# Patient Record
Sex: Female | Born: 1995
Health system: Southern US, Community
[De-identification: ages and names within clinical notes are randomized; demographics above are authoritative.]

## PROBLEM LIST (undated history)

## (undated) DIAGNOSIS — F419 Anxiety disorder, unspecified: Secondary | ICD-10-CM

## (undated) HISTORY — DX: Anxiety disorder, unspecified: F41.9

---
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
## 2017-01-15 ENCOUNTER — Ambulatory Visit (INDEPENDENT_AMBULATORY_CARE_PROVIDER_SITE_OTHER): Payer: BLUE CROSS/BLUE SHIELD | Admitting: Family

## 2017-01-15 ENCOUNTER — Encounter: Payer: Self-pay | Admitting: Family

## 2017-01-15 VITALS — BP 102/64 | HR 68 | Resp 16 | Ht 59.5 in | Wt 99.6 lb

## 2017-01-15 DIAGNOSIS — Z719 Counseling, unspecified: Secondary | ICD-10-CM | POA: Diagnosis not present

## 2017-01-15 DIAGNOSIS — Z7189 Other specified counseling: Secondary | ICD-10-CM

## 2017-01-15 DIAGNOSIS — Z8659 Personal history of other mental and behavioral disorders: Secondary | ICD-10-CM | POA: Diagnosis not present

## 2017-01-15 DIAGNOSIS — Z79899 Other long term (current) drug therapy: Secondary | ICD-10-CM | POA: Diagnosis not present

## 2017-01-15 DIAGNOSIS — F900 Attention-deficit hyperactivity disorder, predominantly inattentive type: Secondary | ICD-10-CM

## 2017-01-15 MED ORDER — GUANFACINE HCL ER 2 MG PO TB24: 2.0000 mg | ORAL_TABLET | Freq: Every day | ORAL | 2 refills | Status: DC

## 2017-01-15 MED ORDER — AMPHETAMINE SULFATE 10 MG PO TABS: 10.0000 mg | ORAL_TABLET | Freq: Every day | ORAL | 0 refills | Status: DC

## 2017-01-15 NOTE — Progress Notes (Signed)
[facility] [address] [place] [place] [address] Dept: [phone] Dept Fax: [phone] Loc: [phone] Loc Fax: [phone]  Medical Follow-up  Patient ID: [name], female  DOB: Sep 20, 1995, 21 y.o.  MRN: [id number]  Date of Evaluation: 01/15/17  PCP: [name], MD  Accompanied by: Self Patient Lives with: On campus in dorm-single room  HISTORY/CURRENT STATUS:  HPI  Patient here for routine follow up related to ADHD and medication management. Patient on medication last year and over focused last year. PCP worried that she was not gaining weight, but has always been on the smaller side. Came off the medication for the summer and increased problems with completing work. Retried Vyvanse with increased anxiety, weight loss, sleep issues, and constant worrying. Stopped Vyvanse and tried Adderall XR with increased side effects. Now not taking any medications, but wanting to retry a different medication with guidance.   EDUCATION: School: [employer], [employer] and [employer] Year/Grade: Sophomore Homework Time: Increased amount of time Performance/Grades: above average Services: Other: Disability services Activities/Exercise: participates in field hockey  MEDICAL HISTORY: Appetite: Good MVI/Other: Daily  Fruits/Vegs:Some Calcium: Some Iron:Some  Sleep: Bedtime: 11-12:00 am  Awakens: 7-9:00 am Sleep Concerns: Initiation/Maintenance/Other: No problems with falling asleep or staying asleep  Individual Medical History/Review of System Changes? No problems and no recent doctor visits by patient.   Allergies: Patient has no known allergies.  Current Medications:  Current Outpatient Prescriptions:  .  norgestimate-ethinyl estradiol (ORTHO-CYCLEN,SPRINTEC,PREVIFEM) 0.25-35 MG-MCG tablet, Take 1 tablet by mouth daily.,  Disp: 3 Package, Rfl: 2 .  Amphetamine Sulfate ([name]) 10 MG TABS, Take 10 mg by mouth daily., Disp: 30 tablet, Rfl: 0 .  guanFACINE (INTUNIV) 2 MG TB24 ER tablet, Take 1 tablet (2 mg total) by mouth daily., Disp: 30 tablet, Rfl: 2 Medication Side Effects: None  Family Medical/Social History Changes?: None reported  MENTAL HEALTH: Mental Health Issues: Socially feels some what isolated, some interactions and has club sports  PHYSICAL EXAM: Vitals:  Today's Vitals   01/15/17 1027  BP: 102/64  Pulse: 68  Resp: 16  Weight: 99 lb 9.6 oz (45.2 kg)  Height: 4' 11.5" (1.511 m)  PainSc: 0-No pain  , 25 %ile (Z= -0.67) based on CDC 2-20 Years BMI-for-age data using vitals from 01/15/2017.  General Exam: Physical Exam  Constitutional: She is oriented to person, place, and time. She appears well-developed and well-nourished.  HENT:  Head: Normocephalic and atraumatic.  Right Ear: External ear normal.  Left Ear: External ear normal.  Mouth/Throat: Oropharynx is clear and moist.  Eyes: Pupils are equal, round, and reactive to light. Conjunctivae and EOM are normal.  Neck: Normal range of motion. Neck supple.  Cardiovascular: Normal rate, regular rhythm, normal heart sounds and intact distal pulses.   Pulmonary/Chest: Effort normal and breath sounds normal.  Abdominal: Soft. Bowel sounds are normal.  Genitourinary:  Genitourinary Comments: deferred  Musculoskeletal: Normal range of motion.  Neurological: She is alert and oriented to person, place, and time. She has normal reflexes.  Skin: Skin is warm and dry. Capillary refill takes less than 2 seconds.  Psychiatric: She has a normal mood and affect. Her behavior is normal. Judgment and thought content normal.  Vitals reviewed.  Review of Systems  Psychiatric/Behavioral: Positive for decreased concentration and sleep disturbance. The patient is nervous/anxious.   All other systems reviewed and are negative.  No concerns for  toileting.  Daily stool, no constipation or diarrhea. Void urine no difficulty. No enuresis.   Participate in daily oral hygiene to include brushing and flossing.  Neurological: oriented to time, place, and person Cranial Nerves: normal  Neuromuscular:  Motor Mass: Normal Tone: Normal Strength: Normal DTRs: 2+ and symmetric Overflow: None Reflexes: no tremors noted Sensory Exam: Vibratory: Intact  Fine Touch: Intact  Testing/Developmental Screens:  ASRS: 20/15 scored by patinet and counseled   DIAGNOSES:    ICD-10-CM   1. ADHD (attention deficit hyperactivity disorder), inattentive type F90.0   2. History of anxiety Z86.59   3. Patient counseled Z71.9   4. Medication management Z79.899   5. Counseling regarding goals of care Z71.89     RECOMMENDATIONS: 3-4 weeks for medication management. Counseled on restarting new medication, [name] 1/4-1 tablet daily with instructions on titration, # 30 with no refill script printed and given to patient today. To continue with Intuniv 2 mg daily, # 30 with 2 RF's printed today.   Counseled patient on medication administration, effects, and possible side effects. ADHD medications discussed to include different medications and pharmacologic properties of each. Recommendation for specific medication to include dose, administration, expected effects, possible side effects and the risk to benefit ratio of medication management at today's visit with patient of [name].  Advised patient to get at least 8-10 hours of sleep each night with good sleep hygiene reviewed with patient. Continue on a routine sleep/wake schedule with decreasing caffeine in the evening and shutting off all screens at least 1 hour before bedtime. Can take melatonin as needed for initiation.   Information reviewed with patient regarding healthy dietary intake of protein and fruits/vegetable with nuts for at least 3 meals plus snack each day to maintain weight.   Suggested regular  exercise for 30 mins at least 3-4 times/week. Patient playing club field hockey and waking, but encouraged more exercise when able to on campus.  Directed to f/u with PCP as needed, dentist every 6 months, MVI daily, dietary intake of good calories/protein, exercise regularly, f/u with GYN, counseling, and dermatology for health maintenance.   Patient verbalized understanding of all topics discussed at today's visit.   NEXT APPOINTMENT: Return in about 3 weeks (around 02/05/2017) for follow up visit.  More than 50% of the appointment was spent counseling and discussing diagnosis and management of symptoms with the patient and family.  Carron Curie, NP Counseling Time: 30 mins Total Contact Time: 40 mins

## 2017-02-12 ENCOUNTER — Ambulatory Visit (INDEPENDENT_AMBULATORY_CARE_PROVIDER_SITE_OTHER): Payer: BLUE CROSS/BLUE SHIELD | Admitting: Family

## 2017-02-12 ENCOUNTER — Encounter: Payer: Self-pay | Admitting: Family

## 2017-02-12 VITALS — BP 112/64 | HR 68 | Resp 16 | Ht 59.5 in | Wt 97.4 lb

## 2017-02-12 DIAGNOSIS — F411 Generalized anxiety disorder: Secondary | ICD-10-CM

## 2017-02-12 DIAGNOSIS — F900 Attention-deficit hyperactivity disorder, predominantly inattentive type: Secondary | ICD-10-CM | POA: Diagnosis not present

## 2017-02-12 DIAGNOSIS — Z719 Counseling, unspecified: Secondary | ICD-10-CM | POA: Diagnosis not present

## 2017-02-12 DIAGNOSIS — Z79899 Other long term (current) drug therapy: Secondary | ICD-10-CM

## 2017-02-12 MED ORDER — AMPHETAMINE SULFATE 10 MG PO TABS: 10.0000 mg | ORAL_TABLET | Freq: Every day | ORAL | 0 refills | Status: DC

## 2017-02-12 NOTE — Progress Notes (Signed)
[facility] [address] [place] [place] [address] Dept: [phone] Dept Fax: [phone] Loc: [phone] Loc Fax: [phone]  Medication Check  Patient ID: Vickie Miller, female  DOB: [birth year], 21 y.o.  MRN: [id number]  Date of Evaluation: 02/12/17  PCP: [name], MD  Accompanied by: Self Patient Lives with: Dorm room on campus-single  HISTORY/CURRENT STATUS: HPI  Patient here for routine follow up related to ADHD, Anxiety, and medication management. Patient here by herself for today's visit and updates with recent change in medication. [name] now 10 mg daily recently with slow progression to get to full tablet with no sleep issues or side effects. Feels better on this medication without increased issues with side effects  Will also continue with Intuniv 2 mg daily with no reported side effects.   EDUCATION: School: [employer] Year/Grade: Sophomore Homework Hours Spent: Dependging on class demands Performance/ Grades: above average Services: Careers information officer Exercise: intermittently-field hockey  MEDICAL HISTORY: Appetite: Some decrease with afternoon, but eating a good breakfast with protein  MVI/Other: Daily  Fruits/Vegs: Variety Calcium: Some mg  Iron: Variety.   Sleep: Getting in bed at 10-12:00 and waking by 9:00 am the latest Concerns: Initiation/Maintenance/Other: None recently and will take Melatonin 5 mg 1/2 tablet prn.   Individual Medical History/ Review of Systems: Changes? :None reported recently.   Allergies: Patient has no known allergies.  Current Medications:  Current Outpatient Prescriptions:  .  Amphetamine Sulfate ([name]) 10 MG TABS, Take 10-20 mg by mouth daily., Disp: 60 tablet, Rfl: 0 .  guanFACINE (INTUNIV) 2 MG TB24 ER tablet, Take 1 tablet (2 mg total) by mouth daily., Disp: 30  tablet, Rfl: 2 .  norgestimate-ethinyl estradiol (ORTHO-CYCLEN,SPRINTEC,PREVIFEM) 0.25-35 MG-MCG tablet, Take 1 tablet by mouth daily., Disp: 3 Package, Rfl: 2 Medication Side Effects: None  Family Medical/ Social History: Changes? None reported recently.   MENTAL HEALTH: Mental Health Issues: Anxiety-lower now  PHYSICAL EXAM; Vitals: There were no vitals taken for this visit.  General Physical Exam: Unchanged from previous exam, date:01/15/17 Changed: Medication  Testing/Developmental Screens:  14/12 scored by patient and counseled   DIAGNOSES:    ICD-10-CM   1. ADHD (attention deficit hyperactivity disorder), inattentive type F90.0   2. Generalized anxiety disorder F41.1   3. Patient counseled Z71.9   4. Medication management Z79.899     RECOMMENDATIONS: 3 month follow up visit and continuation of medication. Counseled on medication management of [name] to try 1-2 tablets daily 10 mg, # 60 with no refills printed and given to patient. Intuniv 2 mg daily no refills today.   Counseled patient on medication administration, effects, and possible side effects. ADHD medications discussed to include different medications and pharmacologic properties of each. Recommendation for specific medication to include dose, administration, expected effects, possible side effects and the risk to benefit ratio of medication management at today's visit for [name].   Advised patient to increase the amount of protein daily to sustain weight and manage her limited intake during the day. Encouraged an increased amount of smaller meals during the day with protein shakes at least 1-2 daily.   Instructed patient to continue with physical activities as her schedule allows. To restart physical activity when possible with anxiety control.  Directed to call for future scripts and support given. Patient verbalized understanding of all topics discussed at today's visit.   NEXT APPOINTMENT: Return in about 3  months (around 05/15/2017) for follow up visit.  More than 50% of the appointment was spent counseling and discussing diagnosis and management of symptoms with the patient and family.  Carron Curieawn M Paretta-Leahey, NP Counseling Time: 25 mins Total Contact Time: 30 mins

## 2017-03-18 DIAGNOSIS — Z0000 Encounter for general adult medical examination without abnormal findings: Secondary | ICD-10-CM | POA: Diagnosis not present

## 2017-04-13 ENCOUNTER — Other Ambulatory Visit: Payer: Self-pay | Admitting: Family

## 2017-04-13 NOTE — Telephone Encounter (Signed)
Printed Rx and placed at front desk for pick-up-[name] 10 mg BID, # 60 with no refills.

## 2017-04-29 DIAGNOSIS — Z0000 Encounter for general adult medical examination without abnormal findings: Secondary | ICD-10-CM | POA: Diagnosis not present

## 2017-05-04 DIAGNOSIS — L218 Other seborrheic dermatitis: Secondary | ICD-10-CM | POA: Diagnosis not present

## 2017-05-06 DIAGNOSIS — F909 Attention-deficit hyperactivity disorder, unspecified type: Secondary | ICD-10-CM | POA: Diagnosis not present

## 2017-05-06 DIAGNOSIS — Z23 Encounter for immunization: Secondary | ICD-10-CM | POA: Diagnosis not present

## 2017-05-06 DIAGNOSIS — Z0000 Encounter for general adult medical examination without abnormal findings: Secondary | ICD-10-CM | POA: Diagnosis not present

## 2017-05-08 ENCOUNTER — Other Ambulatory Visit: Payer: Self-pay | Admitting: Family

## 2017-05-08 NOTE — Telephone Encounter (Signed)
RX for Intuniv 2 mg daily # 30 with 2 RF's e-scribed and sent to pharmacy-[facility] on file.

## 2017-05-15 ENCOUNTER — Encounter: Payer: Self-pay | Admitting: Family

## 2017-05-15 ENCOUNTER — Ambulatory Visit (INDEPENDENT_AMBULATORY_CARE_PROVIDER_SITE_OTHER): Payer: BLUE CROSS/BLUE SHIELD | Admitting: Family

## 2017-05-15 ENCOUNTER — Institutional Professional Consult (permissible substitution): Payer: BLUE CROSS/BLUE SHIELD | Admitting: Family

## 2017-05-15 VITALS — BP 102/64 | HR 64 | Resp 16 | Ht 59.5 in | Wt 96.0 lb

## 2017-05-15 DIAGNOSIS — F411 Generalized anxiety disorder: Secondary | ICD-10-CM

## 2017-05-15 DIAGNOSIS — Z719 Counseling, unspecified: Secondary | ICD-10-CM | POA: Diagnosis not present

## 2017-05-15 DIAGNOSIS — Z79899 Other long term (current) drug therapy: Secondary | ICD-10-CM

## 2017-05-15 DIAGNOSIS — F900 Attention-deficit hyperactivity disorder, predominantly inattentive type: Secondary | ICD-10-CM

## 2017-05-15 MED ORDER — AMPHETAMINE SULFATE 10 MG PO TABS: 1.0000 | ORAL_TABLET | Freq: Every day | ORAL | 0 refills | Status: DC

## 2017-05-15 NOTE — Progress Notes (Signed)
[facility] [address] [place] [address] Dept: [phone] Dept Fax: [phone] Loc: [phone] Loc Fax: [phone]  Medication Check  Patient ID: Nelma RothmanFlorentina A Sillas, female  DOB: [birth year], 22 y.o.  MRN: [id number]  Date of Evaluation: 05/19/2017   PCP: Merri BrunettePharr, Walter, MD  Accompanied by: Self Patient Lives with: Dorm room on campus-single  HISTORY/CURRENT STATUS: HPI  Patient here for routine follow up related to ADHD, Anxiety, and medication management. Patient here by herself for today's visit and updates given for recent changes with school semester. No recent issues with health and eating ok with no issues. Sleeping well with no current issues. Has done well the first part of the semester and will finish with her mini-mester the first part of February before spring semester starts. Has continued with [name] and Intuniv with no reported side effects.   EDUCATION: School: [employer] Year/Grade: sophomore Homework Hours Spent: Depending on class demands Performance/ Grades: above average Services: Other: Disability Services Activities/ Exercise: intermittently  MEDICAL HISTORY: Appetite:Decreased appetite most morning, eating a good dinner  MVI/Other: None  Fruits/Vegs: Some good variety Calcium: Good amount  Iron: Variety  Sleep: Most nights will get about 7 hours/night sleep Concerns: Initiation/Maintenance/Other: No reported issues recently  Individual Medical History/ Review of Systems: Changes? :None reported recently per patient  Allergies: Patient has no known allergies.  Current Medications:  Current Outpatient Medications:  .  Amphetamine Sulfate ([name]) 10 MG TABS, Take 1-2 tablets by mouth daily. Do not fill until 06/12/17, Disp: 60 tablet, Rfl: 0 .  guanFACINE (INTUNIV) 2 MG TB24 ER tablet, take 1 tablet by  mouth once daily, Disp: 30 tablet, Rfl: 2 .  norgestimate-ethinyl estradiol (ORTHO-CYCLEN,SPRINTEC,PREVIFEM) 0.25-35 MG-MCG tablet, Take 1 tablet by mouth daily., Disp: 3 Package, Rfl: 4 Medication Side Effects: None  Family Medical/ Social History: Changes? None recently  MENTAL HEALTH: Mental Health Issues: Anxiety-no problems reported recently  PHYSICAL EXAM; Vitals: Vitals:   05/15/17 1509  BP: 102/64  Pulse: 64  Resp: 16  Weight: 96 lb (43.5 kg)  Height: 4' 11.5" (1.511 m)    General Physical Exam: Unchanged from previous exam, date:02/12/17 Changed:none recently  Physical Exam  Constitutional: She is oriented to person, place, and time. She appears well-developed and well-nourished.  HENT:  Head: Normocephalic and atraumatic.  Right Ear: External ear normal.  Left Ear: External ear normal.  Mouth/Throat: Oropharynx is clear and moist.  Eyes: Conjunctivae and EOM are normal. Pupils are equal, round, and reactive to light.  Neck: Normal range of motion. Neck supple.  Cardiovascular: Normal rate, regular rhythm, normal heart sounds and intact distal pulses.  Pulmonary/Chest: Effort normal and breath sounds normal.  Abdominal: Soft. Bowel sounds are normal.  Genitourinary:  Genitourinary Comments: Deferred  Musculoskeletal: Normal range of motion.  Neurological: She is alert and oriented to person, place, and time. She has normal reflexes.  Skin: Skin is warm and dry. Capillary refill takes less than 2 seconds.  Psychiatric: She has a normal mood and affect. Her behavior is normal. Judgment and thought content normal.  Vitals reviewed.  Review of Systems  All other systems reviewed and are negative.  Patient with no concerns for toileting. Daily stool, no constipation or diarrhea. Void urine no difficulty. No enuresis.   Participate in daily oral hygiene to include brushing and flossing.  Testing/Developmental Screens: ASRS-Did not complete at today's  visit.  DIAGNOSES:  ICD-10-CM   1. ADHD (attention deficit hyperactivity disorder), inattentive type F90.0   2. Generalized anxiety disorder F41.1   3. Patient counseled Z71.9   4. Medication management Z79.899     RECOMMENDATIONS: 3 month follow up and continuation with medication. Rx for [name] 10 mg daily 1-2 tablets, # 60 with escribing Intuniv 2 mg daily, # 30 with 2 RF's sent to pharmacy on file.   Reviewed old records and/or current chart since last follow up visit on 01/15/17.   Discussed recent history and today's examination with questions answered and concerns address with paitent.   Counseled regarding anticipatory guidance related to school this year and transfer plans in the future.   Recommended a high protein, low sugar and preservatives diet for ADHD patients with limited junk or fast foods along with increasing water intake daily.   Counseled on the need to increase exercise and make healthy eating choices with more variety of foods each daily and eating at least 3-5 smaller meals. Increasing physical activity when able to and increasing her water intake.   Discussed school progress and advocated for appropriate accommodations/modifications as needed in the college setting. Smaller class sizes now and not causing increased anxiety.   Advised on medication options, administration, effects, and possible side effects with [name] and Intuniv reviewed with patient.   Instructed on the importance of good sleep hygiene, a routine bedtime, no TV in bedroom and turning off all electronics at least 1 hour before bedtime.   Directed to f/u with PCP yearly, GYN for routine care, dentist every 6 months, MVI daily, healthy eating habits, regular exercise, and health sleep habits.   NEXT APPOINTMENT: Return in about 3 months (around 08/13/2017) for follow up visit.   More than 50% of the appointment was spent counseling and discussing diagnosis and management of symptoms with the  patient and family.  Carron Curie, NP Counseling Time: 30 mins Total Contact Time: 40 mins

## 2017-05-18 ENCOUNTER — Encounter: Payer: Self-pay | Admitting: Women's Health

## 2017-05-19 ENCOUNTER — Ambulatory Visit (INDEPENDENT_AMBULATORY_CARE_PROVIDER_SITE_OTHER): Payer: BLUE CROSS/BLUE SHIELD | Admitting: Women's Health

## 2017-05-19 ENCOUNTER — Encounter: Payer: Self-pay | Admitting: Family

## 2017-05-19 ENCOUNTER — Encounter: Payer: Self-pay | Admitting: Women's Health

## 2017-05-19 VITALS — BP 110/78 | Ht 59.0 in | Wt 98.0 lb

## 2017-05-19 DIAGNOSIS — Z3041 Encounter for surveillance of contraceptive pills: Secondary | ICD-10-CM

## 2017-05-19 DIAGNOSIS — Z01419 Encounter for gynecological examination (general) (routine) without abnormal findings: Secondary | ICD-10-CM | POA: Diagnosis not present

## 2017-05-19 MED ORDER — NORGESTIMATE-ETH ESTRADIOL 0.25-35 MG-MCG PO TABS: 1.0000 | ORAL_TABLET | Freq: Every day | ORAL | 4 refills | Status: DC

## 2017-05-19 NOTE — Patient Instructions (Signed)

## 2017-05-19 NOTE — Progress Notes (Signed)
Vickie Miller 08/29/1995 [id number]    History:    Presents for annual exam.  Regular monthly cycle on Sprintec with good relief of dysmenorrhea and menorrhagia. Virgin. Gardasil series completed.  Past medical history, past surgical history, family history and social history were all reviewed and documented in the EPIC chart. Sophomore at [employer] program doing well. [place] from [place] no known family history.  ROS:  A ROS was performed and pertinent positives and negatives are included.  Exam:  Vitals:   05/19/17 0933  BP: 110/78  Weight: 98 lb (44.5 kg)  Height: 4\' 11"  (1.499 m)   Body mass index is 19.79 kg/m.   General appearance:  Normal Thyroid:  Symmetrical, normal in size, without palpable masses or nodularity. Respiratory  Auscultation:  Clear without wheezing or rhonchi Cardiovascular  Auscultation:  Regular rate, without rubs, murmurs or gallops  Edema/varicosities:  Not grossly evident Abdominal  Soft,nontender, without masses, guarding or rebound.  Liver/spleen:  No organomegaly noted  Hernia:  None appreciated  Skin  Inspection:  Grossly normal   Breasts: Examined lying and sitting.     Right: Without masses, retractions, discharge or axillary adenopathy.     Left: Without masses, retractions, discharge or axillary adenopathy. Gentitourinary   Inguinal/mons:  Normal without inguinal adenopathy  External genitalia:  Normal  BUS/Urethra/Skene's glands:  Normal  Vagina:  Normal  Cervix: Not visualized  Uterus:   normal in size, shape and contour.  Midline and mobile  Adnexa/parametria:     Rt: Without masses or tenderness.   Lt: Without masses or tenderness.  Anus and perineum: Normal    Assessment/Plan:  22 y.o. S [place]  [place] for annual exam with no complaints.  Monthly cycle on Sprintec ADD-primary care manages labs and meds  Plan: Sprintec prescription, proper use, slight risk for blood clots and strokes reviewed. Importance of  condoms if sexually active discussed. Campus safety reviewed. SBE's, exercise, calcium rich diet, MVI daily encouraged. Reports normal CBC at primary care.  Harrington Challengerancy J Shakenna Herrero [facility] CenterWHNP, 10:44 AM 05/19/2017

## 2017-05-25 ENCOUNTER — Telehealth: Payer: Self-pay | Admitting: Family

## 2017-05-25 NOTE — Telephone Encounter (Signed)
[facility]  sent [facility] for [facility]  for Guanfacine HCL Er 2 mg tab.They sent more than one form which did not have a medication name on it .All form are in prior authorization box.

## 2017-06-04 ENCOUNTER — Institutional Professional Consult (permissible substitution): Payer: BLUE CROSS/BLUE SHIELD | Admitting: Family

## 2017-08-31 ENCOUNTER — Other Ambulatory Visit: Payer: Self-pay

## 2017-08-31 MED ORDER — AMPHETAMINE SULFATE 10 MG PO TABS: 1.0000 | ORAL_TABLET | Freq: Every day | ORAL | 0 refills | Status: DC

## 2017-08-31 NOTE — Telephone Encounter (Signed)
Mom called in for refill for [name]. Last visit 05/15/2017. [facility] [address]

## 2017-08-31 NOTE — Telephone Encounter (Signed)
E-Prescribed [name] directly to  [id number] [place] - [address] [place] [address] Phone: [phone] Fax: [phone]

## 2017-10-26 ENCOUNTER — Other Ambulatory Visit: Payer: Self-pay

## 2017-10-26 NOTE — Telephone Encounter (Signed)
Tried to reach patient on cell phone - no voice mail.  Called home number and left message for her to call and schedule appointment.

## 2017-10-26 NOTE — Telephone Encounter (Addendum)
Mom called in for refill for [name]. Last visit 05/15/2017 next visit 11/20/2107. [facility] [address]

## 2017-10-28 ENCOUNTER — Telehealth: Payer: Self-pay

## 2017-10-28 MED ORDER — AMPHETAMINE SULFATE 10 MG PO TABS: 1.0000 | ORAL_TABLET | Freq: Every day | ORAL | 0 refills | Status: DC

## 2017-10-28 NOTE — Telephone Encounter (Signed)
Pharm faxed in Prior Auth for [name]. Prior Auth submitted into CoverMyMeds

## 2017-11-02 NOTE — Telephone Encounter (Signed)
Outcome  Approvedon July 5  Effective from 10/28/2017 through 10/26/2020.

## 2017-11-19 ENCOUNTER — Ambulatory Visit (INDEPENDENT_AMBULATORY_CARE_PROVIDER_SITE_OTHER): Payer: BLUE CROSS/BLUE SHIELD | Admitting: Family

## 2017-11-19 ENCOUNTER — Encounter: Payer: Self-pay | Admitting: Family

## 2017-11-19 VITALS — BP 102/62 | HR 72 | Resp 16 | Ht 59.5 in | Wt 96.0 lb

## 2017-11-19 DIAGNOSIS — Z713 Dietary counseling and surveillance: Secondary | ICD-10-CM

## 2017-11-19 DIAGNOSIS — F411 Generalized anxiety disorder: Secondary | ICD-10-CM | POA: Diagnosis not present

## 2017-11-19 DIAGNOSIS — Z79899 Other long term (current) drug therapy: Secondary | ICD-10-CM

## 2017-11-19 DIAGNOSIS — Z719 Counseling, unspecified: Secondary | ICD-10-CM

## 2017-11-19 DIAGNOSIS — F900 Attention-deficit hyperactivity disorder, predominantly inattentive type: Secondary | ICD-10-CM

## 2017-11-19 MED ORDER — AMPHETAMINE SULFATE 10 MG PO TABS: 1.0000 | ORAL_TABLET | Freq: Every day | ORAL | 0 refills | Status: DC

## 2017-11-19 MED ORDER — GUANFACINE HCL ER 2 MG PO TB24: 2.0000 mg | ORAL_TABLET | Freq: Every day | ORAL | 2 refills | Status: DC

## 2017-11-19 NOTE — Progress Notes (Signed)
[facility] [address] [place] [address] Dept: [phone] Dept Fax: [phone] Loc: [phone] Loc Fax: [phone]  Follow up/Medication Check  Patient ID: Vickie Miller, female  DOB: 11-29-1995, 22 y.o.  MRN: [id number]  Date of Evaluation: 11/19/2017  PCP: Merri BrunettePharr, Walter, MD  Accompanied by: self Patient Lives with: mother and father  HISTORY/CURRENT STATUS: HPI  Patient here for routine follow up related to ADHD, Anxiety, and medication management. Patient here by herself for today's visit and interactive with provider. No recent issues with school and taking 1 online class this summer. To take 15 credit hours next semester and studying abroad for the winter term. 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 Has continued to take Concerta 36 mg most days this summer and will continue to take regularly next year. No reported side effects or adverse effects of the medication.  EDUCATION: School: [employer] and looking at transferring to [employer] for [employer] Year/Grade: [employer]ophomore/Junior Homework Hours Spent: Online class now Performance/ Grades: average Services: Other: Disability Services on campus Activities/ Exercise: intermittently-working out more  MEDICAL HISTORY: Appetite: Better  MVI/Other: Ensure drinks to supplement diet  Fruits/Vegs: some Calcium: Some Iron: variety  Sleep: Trouble falling asleep depending on activities. Some nights will sleep 4 hours. Can use Melatonin as needed for sleep initiation. Sleeping well at school, but not as tired at home.   Individual Medical History/ Review of Systems: Changes? :No doctor visits recently. Patient reported recently over the last 2 weeks or so increased HR that she has noticed.   Allergies: Patient has no known  allergies.  Current Medications:  Current Outpatient Medications:  .  Amphetamine Sulfate ([name]) 10 MG TABS, Take 1-2 tablets by mouth daily., Disp: 60 tablet, Rfl: 0 .  guanFACINE (INTUNIV) 2 MG TB24 ER tablet, Take 1 tablet (2 mg total) by mouth daily., Disp: 30 tablet, Rfl: 2 .  norgestimate-ethinyl estradiol (ORTHO-CYCLEN,SPRINTEC,PREVIFEM) 0.25-35 MG-MCG tablet, Take 1 tablet by mouth daily., Disp: 3 Package, Rfl: 4 Medication Side Effects: None  Family Medical/ Social History: Changes? None reported recently.   MENTAL HEALTH: Mental Health Issues: Anxiety-some decreased  PHYSICAL EXAM; Vitals: Vitals:   11/19/17 0814  BP: 102/62  Pulse: 72  Resp: 16  Weight: 96 lb (43.5 kg)  Height: 4' 11.5" (1.511 m)   Physical Exam  Constitutional: She is oriented to person, place, and time. She appears well-developed and well-nourished.  HENT:  Head: Normocephalic and atraumatic.  Right Ear: External ear normal.  Left Ear: External ear normal.  Nose: Nose normal.  Mouth/Throat: Oropharynx is clear and moist.  Eyes: Pupils are equal, round, and reactive to light. Conjunctivae and EOM are normal.  Neck: Normal range of motion. Neck supple.  Cardiovascular: Normal rate, regular rhythm, normal heart sounds and intact distal pulses.  Abdominal: Soft. Bowel sounds are normal.  Musculoskeletal: Normal range of motion.  Neurological: She is alert and oriented to person, place, and time. She has normal reflexes.  Skin: Skin is warm and dry.  Psychiatric: She has a normal mood and affect. Her behavior is normal. Judgment and thought content normal.  Vitals reviewed.  Review of Systems  All other systems reviewed and are negative.  No concerns for toileting. Daily stool, no constipation or diarrhea. Void urine no difficulty. No enuresis.   Participate in daily oral hygiene to include brushing and flossing.  Testing/Developmental Screens: OZH:YQMV-78/46.9 scored by patient and  counseled  DIAGNOSES:    ICD-10-CM   1. ADHD (attention deficit hyperactivity disorder), inattentive type F90.0 Amphetamine Sulfate ([name]) 10 MG TABS    guanFACINE (INTUNIV) 2 MG TB24 ER tablet  2. Generalized anxiety disorder F41.1   3. Medication management Z79.899   4. Patient counseled Z71.9   5. Dietary counseling Z71.3     RECOMMENDATIONS: 3-6 month f/u visit and continuation of medication. [name] 10 mg 1-2 daily, # 60 with no refills and Intuniv 2 mg daily # 30 with 2 RF's. RX for above e-scribed and [id number] [place] - [address] [place] [address] Phone: [phone] Fax: [phone]  Counseling at this visit included the review of old records and/or current chart with the patient with updates and changes since last visit.   Discussed recent history and today's examination with patient with no significant changes on exam.   Counseled regarding school and current course load with possible transfer to [employer] for engineering after next year.   Recommended a high protein, low sugar diet for ADHD patients, avoid sugary snacks and drinks, drink more water, eat more fruits and vegetables, increase daily exercise.  Encourage calorie dense foods when hungry. Encourage snacks in the afternoon/evening. Discussed increasing calories of foods with butter, sour cream, mayonnaise, cheese or ranch dressing. Can add potato flakes or powdered milk.   Discussed school academic and behavioral progress and advocated for appropriate accommodations as needed for continued success at college.  Counseled medication administration, effects, and possible side effects with [name]-stimulant.  Advised importance of:  Good sleep hygiene (8- 10 hours per night) Limited screen time (none on school nights, no more than 2 hours on weekends) Regular exercise(outside and active play) Healthy eating (drink water, no sodas/sweet  tea, limit portions and no seconds).   Directed patient to f/u with PCP for EKG related to recent concerns of "palpatations" by patient, patient to keep diary of occurrences and changes that make symptoms better/worse, continued exercise and variety of food for dietary needs and good sleep routine with use of melatonin prn.   NEXT APPOINTMENT: Return in about 3 months (around 02/19/2018) for follow up visit.  Carron Curie, NP Counseling Time: 30 mins Total Contact Time: 40 mins

## 2017-11-19 NOTE — Patient Instructions (Signed)
Follow up with PCP with EKG due to recent increased heart rate at times.

## 2018-01-21 ENCOUNTER — Other Ambulatory Visit: Payer: Self-pay | Admitting: Family

## 2018-01-21 DIAGNOSIS — F900 Attention-deficit hyperactivity disorder, predominantly inattentive type: Secondary | ICD-10-CM

## 2018-01-21 NOTE — Telephone Encounter (Signed)
Last visit 11/19/2017 next visit 02/19/2108

## 2018-01-22 NOTE — Telephone Encounter (Signed)
RX for above e-scribed and sent to pharmacy on record  [facility] [place], [place] - [address] [place] [address] Phone: [phone] Fax: [phone]    

## 2018-01-25 ENCOUNTER — Other Ambulatory Visit: Payer: Self-pay

## 2018-01-25 DIAGNOSIS — F900 Attention-deficit hyperactivity disorder, predominantly inattentive type: Secondary | ICD-10-CM

## 2018-01-25 MED ORDER — AMPHETAMINE SULFATE 10 MG PO TABS: 1.0000 | ORAL_TABLET | Freq: Every day | ORAL | 0 refills | Status: DC

## 2018-01-25 NOTE — Telephone Encounter (Signed)
Mom called in for refill for [name]. Last visit 11/19/2017 next visit 02/18/2018. [facility] [address]

## 2018-02-18 ENCOUNTER — Encounter: Payer: Self-pay | Admitting: Family

## 2018-02-18 ENCOUNTER — Ambulatory Visit (INDEPENDENT_AMBULATORY_CARE_PROVIDER_SITE_OTHER): Payer: BLUE CROSS/BLUE SHIELD | Admitting: Family

## 2018-02-18 VITALS — BP 108/68 | HR 68 | Resp 16 | Ht 59.5 in | Wt 95.6 lb

## 2018-02-18 DIAGNOSIS — F819 Developmental disorder of scholastic skills, unspecified: Secondary | ICD-10-CM | POA: Diagnosis not present

## 2018-02-18 DIAGNOSIS — Z719 Counseling, unspecified: Secondary | ICD-10-CM | POA: Diagnosis not present

## 2018-02-18 DIAGNOSIS — Z7189 Other specified counseling: Secondary | ICD-10-CM

## 2018-02-18 DIAGNOSIS — F900 Attention-deficit hyperactivity disorder, predominantly inattentive type: Secondary | ICD-10-CM

## 2018-02-18 DIAGNOSIS — Z79899 Other long term (current) drug therapy: Secondary | ICD-10-CM

## 2018-02-18 MED ORDER — AMPHETAMINE SULFATE 10 MG PO TABS: 1.0000 | ORAL_TABLET | Freq: Every day | ORAL | 0 refills | Status: DC

## 2018-02-18 NOTE — Progress Notes (Signed)
[facility] [address] [place] [address] Dept: [phone] Dept Fax: [phone] Loc: [phone] Loc Fax: [phone]  Follow up/Medication Check  Patient ID: Vickie Miller, female  DOB: 06/01/1995, 22 y.o.  MRN: [id number]  Date of Evaluation: 02/18/2018  PCP: [name], MD  Accompanied by: Self Patient Lives with: roommate  HISTORY/CURRENT STATUS: HPI  Patient here for routine follow up related to ADHD, Learning problems, and medication management. Patient here by herself and interactive with provider. Patient doing well this semester and feels like it is much easier. Patient still in the engineering transfer program at [employer] and completing necessary requirements for this. Has continued to received services at needed for academic success. Patient continuing with Intuniv and [name] as previous with no side effects reported.   EDUCATION: School: [employer] Year/Grade: junior year [employer] Spent: Increased amount of time Performance/ Grades: above average Services: Other: disability services Activities/ Exercise: intermittently  MEDICAL HISTORY: Appetite: Good  MVI/Other: None  Fruits/Vegs: Some Calcium: Some  Iron: Some  Sleep:  Better with sleeping this semester, at times with have initiation issues due to school work. Concerns: Initiation/Maintenance/Other: At times will have some issues, but not regularly.  Individual Medical History/ Review of Systems: Changes? :Yes, recent URI with no OTC medications.   Allergies: Patient has no known allergies.  Current Medications:  Current Outpatient Medications:  .  Amphetamine Sulfate ([name]) 10 MG TABS, Take 1-2 tablets by mouth daily., Disp: 60 tablet, Rfl: 0 .  guanFACINE (INTUNIV) 2 MG TB24 ER tablet, TAKE 1 TABLET BY MOUTH EVERY DAY, Disp: 90 tablet, Rfl: 0 .   norgestimate-ethinyl estradiol (ORTHO-CYCLEN,SPRINTEC,PREVIFEM) 0.25-35 MG-MCG tablet, Take 1 tablet by mouth daily., Disp: 3 Package, Rfl: 4 Medication Side Effects: None  Family Medical/ Social History: Changes? None  MENTAL HEALTH: Mental Health Issues: Anxiety-less this semester and no symptoms.   PHYSICAL EXAM; Vitals:  Vitals:   02/18/18 0813  BP: 108/68  Pulse: 68  Resp: 16  Weight: 95 lb 9.6 oz (43.4 kg)  Height: 4' 11.5" (1.511 m)   Physical Exam  Constitutional: She is oriented to person, place, and time. She appears well-developed and well-nourished.  HENT:  Head: Normocephalic and atraumatic.  Right Ear: External ear normal.  Left Ear: External ear normal.  Nose: Nose normal.  Mouth/Throat: Oropharynx is clear and moist.  Eyes: Pupils are equal, round, and reactive to light. Conjunctivae and EOM are normal.  Neck: Normal range of motion. Neck supple.  Cardiovascular: Normal rate, regular rhythm, normal heart sounds and intact distal pulses.  Abdominal: Soft. Bowel sounds are normal.  Musculoskeletal: Normal range of motion.  Neurological: She is alert and oriented to person, place, and time. She has normal reflexes.  Skin: Skin is warm and dry. Capillary refill takes less than 2 seconds.  Psychiatric: She has a normal mood and affect. Her behavior is normal. Judgment and thought content normal.  Vitals reviewed.  Review of Systems  All other systems reviewed and are negative.  General Physical Exam: Unchanged from previous exam, date:11/19/17 Changed:None recently  Testing/Developmental Screens:  ASRS-11/11 scored by patient and counseled  DIAGNOSES:    ICD-10-CM   1. Attention deficit hyperactivity disorder (ADHD), predominantly inattentive type F90.0   2. Problems with learning F81.9   3. Medication management Z79.899   4. Patient counseled Z71.9   5. Goals of care, counseling/discussion Z71.89   6. ADHD (  attention deficit hyperactivity disorder),  inattentive type F90.0 Amphetamine Sulfate ([name]) 10 MG TABS    RECOMMENDATIONS: 3 month follow up visit and continuation of medication. [name] 10 mg 2 daily, # 60 and no refills. Intuniv 2 mg daily, no Rx. RX for above e-scribed and sent to pharmacy on record  [facility] [place] [place] - [address] [place] [address] Phone: [phone] Fax: [phone]  Counseling at this visit included the review of old records and/or current chart with the patient/parent   Discussed recent history and today's examination with patient/parent  Counseled regarding school and requirements for transfer to [employer] for engineering.  Recommended a high protein, low sugar diet for ADHD patients, avoid sugary snacks and drinks, drink more water, eat more fruits and vegetables, increase daily exercise.  Encourage calorie dense foods when hungry. Encourage snacks in the afternoon/evening. Add calories to food being consumed like switching to whole milk products, using instant breakfast type powders, increasing calories of foods with butter, sour cream, mayonnaise, cheese or ranch dressing. Can add potato flakes or powdered milk.   Discussed school academic and behavioral progress and advocated for appropriate accommodations as needed for academic success.   Discussed importance of maintaining structure, routine, organization, reward, motivation and consequences with consistency at school.   Counseled medication pharmacokinetics, options, dosage, administration, desired effects, and possible side effects.    Advised importance of:  Good sleep hygiene (8- 10 hours per night, no TV or video games for 1 hour before bedtime) Limited screen time (none on school nights, no more than 2 hours/day on weekends, use of screen time for motivation) Regular exercise(outside and active play) Healthy eating (drink water or milk, no sodas/sweet tea, limit portions and no seconds).     Directed patient to f/u with PCP yearly, eye exam, GYN for OC management, MVI daily, healthy eating with more calories, more physical activity, and good sleep routine.   NEXT APPOINTMENT: Return in about 3 months (around 05/21/2018) for follow up visit.  More than 50% of the appointment was spent counseling and discussing diagnosis and management of symptoms with the patient and family.  Carron Curie, NP Counseling Time: 30 mins Total Contact Time: 40 mins

## 2018-03-23 ENCOUNTER — Other Ambulatory Visit: Payer: Self-pay

## 2018-03-23 DIAGNOSIS — F900 Attention-deficit hyperactivity disorder, predominantly inattentive type: Secondary | ICD-10-CM

## 2018-03-23 MED ORDER — GUANFACINE HCL ER 2 MG PO TB24: 2.0000 mg | ORAL_TABLET | Freq: Every day | ORAL | 0 refills | Status: DC

## 2018-03-23 MED ORDER — AMPHETAMINE SULFATE 10 MG PO TABS: 1.0000 | ORAL_TABLET | Freq: Every day | ORAL | 0 refills | Status: DC

## 2018-03-23 NOTE — Telephone Encounter (Signed)
Mom called in stating that patient will be going overseas on 04/14/2018-05/21/2018 and will need extra pills of [name] to cover trip. Informed mom to call insurance to cover the amount of pills she need and mom said that the insurance approved 120 quantity and would like for us to go ahead and send in RX. Last visit 02/18/2018 next visit 05/28/2018

## 2018-03-23 NOTE — Telephone Encounter (Signed)
Intuniv 2 mg daily # 90 with no refills for 3 month supply. RX for above e-scribed and sent to pharmacy on record  [facility] [place] [id number] [place] [address] Phone: [phone] Fax: [phone]

## 2018-03-23 NOTE — Telephone Encounter (Signed)
Mom called in for refill for Guanfacine. Last visit 02/18/2018 next visit 05/28/2018. [facility] [address]

## 2018-03-23 NOTE — Telephone Encounter (Signed)
[name] 10 mg 1-2 daily, # 120 with no refills. Patient to study abroad for a few weeks. RX for above e-scribed and sent to pharmacy on record  [facility] [place] - [id number] [place] [address] Phone: [phone] Fax: [phone]

## 2018-05-24 DIAGNOSIS — Z0000 Encounter for general adult medical examination without abnormal findings: Secondary | ICD-10-CM | POA: Diagnosis not present

## 2018-05-27 DIAGNOSIS — Z0000 Encounter for general adult medical examination without abnormal findings: Secondary | ICD-10-CM | POA: Diagnosis not present

## 2018-05-27 DIAGNOSIS — Z23 Encounter for immunization: Secondary | ICD-10-CM | POA: Diagnosis not present

## 2018-05-28 ENCOUNTER — Encounter: Payer: Self-pay | Admitting: Family

## 2018-05-28 ENCOUNTER — Ambulatory Visit (INDEPENDENT_AMBULATORY_CARE_PROVIDER_SITE_OTHER): Payer: BLUE CROSS/BLUE SHIELD | Admitting: Family

## 2018-05-28 VITALS — BP 112/68 | HR 76 | Resp 16 | Ht 60.0 in | Wt 99.6 lb

## 2018-05-28 DIAGNOSIS — Z8659 Personal history of other mental and behavioral disorders: Secondary | ICD-10-CM

## 2018-05-28 DIAGNOSIS — Z79899 Other long term (current) drug therapy: Secondary | ICD-10-CM

## 2018-05-28 DIAGNOSIS — R278 Other lack of coordination: Secondary | ICD-10-CM | POA: Diagnosis not present

## 2018-05-28 DIAGNOSIS — F900 Attention-deficit hyperactivity disorder, predominantly inattentive type: Secondary | ICD-10-CM

## 2018-05-28 DIAGNOSIS — Z719 Counseling, unspecified: Secondary | ICD-10-CM

## 2018-05-28 MED ORDER — AMPHETAMINE SULFATE 10 MG PO TABS: 1.0000 | ORAL_TABLET | Freq: Every day | ORAL | 0 refills | Status: DC

## 2018-05-28 MED ORDER — AMPHETAMINE SULFATE 10 MG PO TABS: 2.0000 | ORAL_TABLET | Freq: Every day | ORAL | 0 refills | Status: DC

## 2018-05-28 MED ORDER — GUANFACINE HCL ER 2 MG PO TB24: 2.0000 mg | ORAL_TABLET | Freq: Every day | ORAL | 0 refills | Status: DC

## 2018-05-28 NOTE — Progress Notes (Signed)
Patient ID: Vickie Miller, female   DOB: Aug 29, 1995, 22 y.o.   MRN: [id number] Follow up and Medication Check  Patient ID: Vickie Miller  DOB: [birth year]  MRN: [id number]  DATE:05/28/18 Vickie Brunette, MD  Accompanied by: Patient Patient Lives with: apartment on campus  HISTORY/CURRENT STATUS: HPI  Patient here for routine follow up related to ADHD, learning problems, Anxiety, and medication management. Patient here by herself and interactive with provider. Patient doing well this past semester and spent 20 days in [place] for 1 credit semester. 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 Is participating in some activities and studying an increased amount of time this semester. Patient doing well with current medication with no side effects.   EDUCATION: School: [employer]  Year/Grade: Junior year  Increased amount of time depending on class demands Just finished winter term and went to [place] for 20 days with a 1 credit course.  MEDICAL HISTORY: Appetite: Better with recent trip to [place]  Sleep: Sleeping good and no changes Concerns: Initiation/Maintenance/Other: No issues  Individual Medical History/ Review of Systems: Changes? :None recently. PCP for routine check up yesterday and GYN next week for annual exam.   Family Medical/ Social History: Changes? Yes, brother in [place] and will move for [employer].  Current Medications:  [name] 10 mg 1-2 tablets daily and Intuniv 2 mg Medication Side Effects: None  MENTAL HEALTH: Mental Health Issues:  Anxiety-less  Review of Systems  Psychiatric/Behavioral: Positive for decreased concentration. The patient is nervous/anxious.   All other systems reviewed and are negative.   PHYSICAL EXAM; Vitals:   05/28/18 0931  BP: 112/68  Pulse: 76  Resp: 16  Weight: 99 lb 9.6 oz (45.2 kg)  Height: 5' (1.524 m)   Body mass index is  19.45 kg/m.  General Physical Exam: Unchanged from previous exam, date:02/18/2018   Testing/Developmental Screens: CGI/ASRS = 15/9 scored by patient today.  Reviewed with patient and counseled today.   DIAGNOSES:    ICD-10-CM   1. ADHD (attention deficit hyperactivity disorder), inattentive type F90.0 guanFACINE (INTUNIV) 2 MG TB24 ER tablet    Amphetamine Sulfate ([name]) 10 MG TABS    DISCONTINUED: Amphetamine Sulfate ([name]) 10 MG TABS  2. Dysgraphia R27.8   3. History of anxiety Z86.59   4. Medication management Z79.899   5. Patient counseled Z71.9     RECOMMENDATIONS:  3 month follow up and continuation of medication. [name] 10 mg 2 daily, # 180 with no RF's for 3 month supply and Intuniv 2 mg # 90 with no RF's for 3 month supply. RX for above e-scribed and sent to pharmacy on record  [facility] [place] - [address] [place] [address] Phone: [phone] Fax: [phone]  Counseling at this visit included the review of old records and/or current chart with the patient since last f/u. Traveled abroad for extended time this winter break and looking to transfer next year to [employer].   Discussed recent history and today's examination with patient with no significant changes on exam today.   Counseled regarding class schedule, [place] trip with educational experience, eating and sleeping habits along with peer interactions.   Encourage calorie dense foods when hungry. Encourage snacks in the afternoon/evening. Add calories to food being consumed like switching to whole milk products, using instant breakfast type powders, increasing calories of foods with butter, sour cream, mayonnaise,  cheese or ranch dressing. Can add potato flakes or powdered milk.   Discussed school academic and behavioral progress and advocated for appropriate accommodations as needed at school with disability services.   Discussed importance of  maintaining structure, routine, organization, reward, motivation and consequences with consistency with school work and peer relationships.   Counseled medication pharmacokinetics, options, dosage, administration, desired effects, and possible side effects.    Advised importance of:  Good sleep hygiene (8- 10 hours per night, no TV or video games for 1 hour before bedtime) Limited screen time (none on school nights, no more than 2 hours/day on weekends, use of screen time for motivation) Regular exercise(outside and active play) Healthy eating (drink water or milk, no sodas/sweet tea, limit portions and no seconds).   Patient verbalized understanding of all topics discussed.  NEXT APPOINTMENT:  Return in about 3 months (around 08/26/2018) for follow up visit.  Medical Decision-making: More than 50% of the appointment was spent counseling and discussing diagnosis and management of symptoms with the patient and family.  Counseling Time: 45 minutes Total Contact Time: 50 minutes

## 2018-06-02 ENCOUNTER — Encounter: Payer: BLUE CROSS/BLUE SHIELD | Admitting: Women's Health

## 2018-07-22 ENCOUNTER — Other Ambulatory Visit: Payer: Self-pay | Admitting: Women's Health

## 2018-07-22 DIAGNOSIS — Z3041 Encounter for surveillance of contraceptive pills: Secondary | ICD-10-CM

## 2018-07-22 NOTE — Telephone Encounter (Signed)
Annual exam scheduled on 08/10/18. 

## 2018-08-02 ENCOUNTER — Other Ambulatory Visit: Payer: Self-pay | Admitting: Family

## 2018-08-02 DIAGNOSIS — F900 Attention-deficit hyperactivity disorder, predominantly inattentive type: Secondary | ICD-10-CM

## 2018-08-02 MED ORDER — GUANFACINE HCL ER 2 MG PO TB24: 2.0000 mg | ORAL_TABLET | Freq: Every day | ORAL | 0 refills | Status: DC

## 2018-08-02 NOTE — Telephone Encounter (Signed)
RX for above e-scribed and sent to pharmacy on record  [facility] [place], [place] - [address] [place] [address] Phone: [phone] Fax: [phone]    

## 2018-08-02 NOTE — Telephone Encounter (Signed)
Mom called for refill for 90-day Guanfacine.  Patient last seen 06/02/18, next appointment 09/16/18.  Please send to [facility] in [facility].

## 2018-08-02 NOTE — Telephone Encounter (Signed)
Resubmitted to [facility] in [place]

## 2018-08-02 NOTE — Addendum Note (Signed)
Addended by: CRUMP, BOBI A on: 08/02/2018 04:11 PM   Modules accepted: Orders

## 2018-08-10 ENCOUNTER — Encounter: Payer: Self-pay | Admitting: Women's Health

## 2018-08-11 ENCOUNTER — Other Ambulatory Visit: Payer: Self-pay

## 2018-08-11 MED ORDER — AMPHETAMINE SULFATE 20 MG PO TBDP: 20.0000 mg | ORAL_TABLET | Freq: Every day | ORAL | 0 refills | Status: DC

## 2018-08-11 NOTE — Addendum Note (Signed)
Addended by: Burgess Estelle on: 08/11/2018 02:17 PM   Modules accepted: Orders

## 2018-08-11 NOTE — Telephone Encounter (Signed)
Correction on [name] 20 mg ODT sent for # 90, 3 month supply with no RF's. RX for above e-scribed and sent to pharmacy on record  [facility] [place] - [address]  [place] [address] Phone: [phone] Fax: [phone]

## 2018-08-11 NOTE — Telephone Encounter (Addendum)
Mom called in stating that patient knocked over [name] pills this morning and do not have enough to last until new refill. Spoke to provider about switching patient to [facility] ODT 20mg  and she was fine with change. Last visit 05/28/2018 next visit 09/16/2018. [facility] [address]

## 2018-08-11 NOTE — Telephone Encounter (Signed)
Pharm called in stating that they can not fill 90 day RX due to it being written by NP, will have to send in 30 day. Called and spoke to mom and she was fine with sending in 30day

## 2018-08-11 NOTE — Telephone Encounter (Signed)
Patient requested RF due to pills being knocked over.  [name] 20 mg ODT # 30 with no RF's RX for above e-scribed and sent to pharmacy on record  [facility] [place] - [address] [place] [address] Phone: [phone] Fax: [phone]

## 2018-08-12 ENCOUNTER — Telehealth: Payer: Self-pay

## 2018-08-12 MED ORDER — AMPHETAMINE SULFATE 20 MG PO TBDP: 20.0000 mg | ORAL_TABLET | Freq: Every day | ORAL | 0 refills | Status: DC

## 2018-08-12 NOTE — Telephone Encounter (Signed)
Outcome Approvedtoday Effective from 08/12/2018 through 08/10/2021.

## 2018-08-12 NOTE — Telephone Encounter (Signed)
Pharm faxed in Prior Auth for [name] ODT. Last visit 05/28/2018 next visit 09/16/2018. Submitting Prior Auth to [facility]

## 2018-08-12 NOTE — Addendum Note (Signed)
Addended by: Carron Curie on: 08/12/2018 07:17 AM   Modules accepted: Orders

## 2018-08-12 NOTE — Telephone Encounter (Signed)
Resent [name] 20 mg ODT # 30 with no RF's. RX for above e-scribed and sent to pharmacy on record   [facility] [place] - [address] [place] [address] Phone: [phone] Fax: [phone]

## 2018-09-16 ENCOUNTER — Other Ambulatory Visit: Payer: Self-pay

## 2018-09-16 ENCOUNTER — Ambulatory Visit (INDEPENDENT_AMBULATORY_CARE_PROVIDER_SITE_OTHER): Payer: BLUE CROSS/BLUE SHIELD | Admitting: Family

## 2018-09-16 ENCOUNTER — Encounter: Payer: Self-pay | Admitting: Family

## 2018-09-16 DIAGNOSIS — R278 Other lack of coordination: Secondary | ICD-10-CM | POA: Diagnosis not present

## 2018-09-16 DIAGNOSIS — F900 Attention-deficit hyperactivity disorder, predominantly inattentive type: Secondary | ICD-10-CM | POA: Diagnosis not present

## 2018-09-16 DIAGNOSIS — Z719 Counseling, unspecified: Secondary | ICD-10-CM

## 2018-09-16 DIAGNOSIS — Z79899 Other long term (current) drug therapy: Secondary | ICD-10-CM

## 2018-09-16 DIAGNOSIS — Z8659 Personal history of other mental and behavioral disorders: Secondary | ICD-10-CM | POA: Diagnosis not present

## 2018-09-16 MED ORDER — AMPHETAMINE SULFATE 10 MG PO TABS: 10.0000 mg | ORAL_TABLET | Freq: Two times a day (BID) | ORAL | 0 refills | Status: DC

## 2018-09-16 MED ORDER — AMPHETAMINE SULFATE 20 MG PO TBDP: 20.0000 mg | ORAL_TABLET | Freq: Every day | ORAL | 0 refills | Status: DC

## 2018-09-16 NOTE — Progress Notes (Signed)
[facility] [address] [place] [address] Dept: [phone] Dept Fax: [phone]  Medication Check visit via Virtual Video due to COVID-19  Patient ID:  Vickie MooreFlorentina Miller  female DOB: 07/15/1995   23 y.o.   MRN: [id number]   DATE:09/16/18  PCP: Merri BrunettePharr, Walter, MD  Virtual Visit via Video Note  I connected with  Vickie RothmanFlorentina A Miller [phone] on 09/16/18 at  8:00 AM EDT by a video enabled telemedicine application and verified that I am speaking with the correct person using two identifiers. Patient Location: at home   I discussed the limitations, risks, security and privacy concerns of performing an evaluation and management service by telephone and the availability of in person appointments. I also discussed with the parents that there may be a patient responsible charge related to this service. The parents expressed understanding and agreed to proceed.  Provider: Carron Curieawn M Paretta-Leahey, NP  Location: private residence  HISTORY/CURRENT STATUS: Vickie Miller is here for medication management of the psychoactive medications for ADHD and review of educational and behavioral concerns.   Vickie Miller currently taking [name] and Intuniv,  which is working well. Takes medication when she gets up in the morning. Medication tends to wear off around 5-6:00 pm. Vickie KittenFlorentina is able to focus through homework.   Vickie Miller is eating well (eating breakfast, lunch and dinner). No real changes.   Sleeping well (getting plenty of sleep), sleeping through the night.   EDUCATION: School: [employer] Year/Grade: college-Junior Year and to transfer next year to [employer] 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  Vickie KittenFlorentina is currently out of school due to  social distancing due to COVID-19 and online the remainder of the semester for the spring.   Activities/ Exercise: daily with some sort of activity.  Screen time: (phone, tablet, TV, computer): TV, Computer, and movies  MEDICAL HISTORY: Individual Medical History/ Review of Systems: Changes? : None reported recently. Had Hives last week and nothing that she can remember coming in contact with, but possible stress induced. May f/u with PCP for oral medication, but using Benedryl for now OTC.   Family Medical/ Social History: Changes? None  Patient Lives with: parents  Current Medications:  Outpatient Encounter Medications as of 09/16/2018  Medication Sig  . acyclovir (ZOVIRAX) 400 MG tablet   . Amphetamine Sulfate ([name] ODT) 20 MG TBDP Take 20 mg by mouth daily.  Marland Kitchen. guanFACINE (INTUNIV) 2 MG TB24 ER tablet Take 1 tablet (2 mg total) by mouth daily.  . SPRINTEC 28 0.25-35 MG-MCG tablet TAKE 1 TABLET BY MOUTH EVERY DAY  . [DISCONTINUED] Amphetamine Sulfate ([name] ODT) 20 MG TBDP Take 20 mg by mouth daily.   No facility-administered encounter medications on file as of 09/16/2018.    Medication Side Effects: None  MENTAL HEALTH: Mental Health Issues:   Anxiety-not as much  Vickie Miller denies thoughts of hurting self or others, denies depression, anxiety, or fears.   DIAGNOSES:    ICD-10-CM   1. ADHD (attention deficit hyperactivity disorder), inattentive type F90.0   2. History of anxiety Z86.59   3. Dysgraphia R27.8   4. Medication management Z79.899   5. Patient counseled Z71.9     RECOMMENDATIONS:  Discussed recent history with patient with updates related to health and learning since last f/u appointment.   Discussed school academic progress and home school  progress using appropriate accommodations with support for continued success.   Discussed continued need for routine, structure, motivation, reward and positive reinforcement with school and studying for the summer class.   Encouraged recommended limitations on TV, tablets, phones, video games and computers for non-educational activities.   Discussed need for bedtime routine, use of good sleep hygiene, no video games, TV or phones for an hour before bedtime.   Encouraged physical activity and outdoor play, maintaining social distancing.   Counseled medication pharmacokinetics, options, dosage, administration, desired effects, and possible side effects.   [name] 20 mg ODT daily, # 30 with no RF's and continue with Intuniv with no Rx needed today. RX for above e-scribed and sent to pharmacy on record  [facility] [address] [place] - [address] [place] [address] Phone: [phone] Fax: [phone]  I discussed the assessment and treatment plan with the patient. The patient  was provided an opportunity to ask questions and all were answered. The patient agreed with the plan and demonstrated an understanding of the instructions.   I provided 40 minutes of non-face-to-face time during this encounter.   Completed record review for 10 minutes prior to the virtual video visit.   NEXT APPOINTMENT:  Return in about 3 months (around 12/17/2018) for follow up visit.  The patient was advised to call back or seek an in-person evaluation if the symptoms worsen or if the condition fails to improve as anticipated.  Medical Decision-making: More than 50% of the appointment was spent counseling and discussing diagnosis and management of symptoms with the patient and family.  Carron Curie, NP

## 2018-09-16 NOTE — Telephone Encounter (Signed)
Patient requested a change to her earlier Rx today from ODT to regular [name] 10 mg 1 BID, # 60 with no RF's. RX for above e-scribed and sent to pharmacy on record   [facility] [address] [place] - [address] [place] [address] Phone: [phone] Fax: [phone]

## 2018-09-16 NOTE — Telephone Encounter (Signed)
Patient called in stsaing that she would like to go back to [facility] 10mg  BID. Spoke with Provider and she is fine with changing Rx.

## 2018-09-17 DIAGNOSIS — L509 Urticaria, unspecified: Secondary | ICD-10-CM | POA: Diagnosis not present

## 2018-09-21 MED ORDER — AMPHETAMINE SULFATE 10 MG PO TABS: 10.0000 mg | ORAL_TABLET | Freq: Two times a day (BID) | ORAL | 0 refills | Status: DC

## 2018-09-21 NOTE — Telephone Encounter (Signed)
Resent Rx to different pharmacy per patient request. [name] 10 mg BID, # 60 with no RF's.

## 2018-09-21 NOTE — Addendum Note (Signed)
Addended by: Carron Curie on: 09/21/2018 09:25 AM   Modules accepted: Orders

## 2018-09-21 NOTE — Telephone Encounter (Signed)
Patient called in and would like [name] sent to [facility] on [address]

## 2018-09-21 NOTE — Addendum Note (Signed)
Addended by: Burgess Estelle on: 09/21/2018 09:17 AM   Modules accepted: Orders

## 2018-09-23 DIAGNOSIS — H5202 Hypermetropia, left eye: Secondary | ICD-10-CM | POA: Diagnosis not present

## 2018-09-23 DIAGNOSIS — H53002 Unspecified amblyopia, left eye: Secondary | ICD-10-CM | POA: Diagnosis not present

## 2018-10-01 DIAGNOSIS — L501 Idiopathic urticaria: Secondary | ICD-10-CM | POA: Diagnosis not present

## 2018-10-07 ENCOUNTER — Other Ambulatory Visit: Payer: Self-pay | Admitting: Women's Health

## 2018-10-07 DIAGNOSIS — Z3041 Encounter for surveillance of contraceptive pills: Secondary | ICD-10-CM

## 2018-10-07 NOTE — Telephone Encounter (Signed)
annual scheduled on 10/12/18

## 2018-10-11 ENCOUNTER — Other Ambulatory Visit: Payer: Self-pay

## 2018-10-12 ENCOUNTER — Ambulatory Visit (INDEPENDENT_AMBULATORY_CARE_PROVIDER_SITE_OTHER): Payer: BC Managed Care – PPO | Admitting: Women's Health

## 2018-10-12 ENCOUNTER — Encounter: Payer: Self-pay | Admitting: Women's Health

## 2018-10-12 VITALS — BP 110/78 | Ht 59.0 in | Wt 94.0 lb

## 2018-10-12 DIAGNOSIS — Z01419 Encounter for gynecological examination (general) (routine) without abnormal findings: Secondary | ICD-10-CM

## 2018-10-12 DIAGNOSIS — L509 Urticaria, unspecified: Secondary | ICD-10-CM | POA: Diagnosis not present

## 2018-10-12 LAB — CBC WITH DIFFERENTIAL/PLATELET
Absolute Monocytes: 631 cells/uL (ref 200–950)
Basophils Absolute: 53 cells/uL (ref 0–200)
Basophils Relative: 0.7 %
Eosinophils Absolute: 190 cells/uL (ref 15–500)
Eosinophils Relative: 2.5 %
HCT: 38.2 % (ref 35.0–45.0)
Hemoglobin: 13 g/dL (ref 11.7–15.5)
Lymphs Abs: 3025 cells/uL (ref 850–3900)
MCH: 29.9 pg (ref 27.0–33.0)
MCHC: 34 g/dL (ref 32.0–36.0)
MCV: 87.8 fL (ref 80.0–100.0)
MPV: 9.6 fL (ref 7.5–12.5)
Monocytes Relative: 8.3 %
Neutro Abs: 3701 cells/uL (ref 1500–7800)
Neutrophils Relative %: 48.7 %
Platelets: 299 10*3/uL (ref 140–400)
RBC: 4.35 10*6/uL (ref 3.80–5.10)
RDW: 12 % (ref 11.0–15.0)
Total Lymphocyte: 39.8 %
WBC: 7.6 10*3/uL (ref 3.8–10.8)

## 2018-10-12 LAB — TSH: TSH: 1.4 mIU/L

## 2018-10-12 MED ORDER — ACYCLOVIR 400 MG PO TABS: ORAL_TABLET | ORAL | 1 refills | Status: AC

## 2018-10-12 NOTE — Patient Instructions (Addendum)
Health Maintenance, Female Adopting a healthy lifestyle and getting preventive care can go a long way to promote health and wellness. Talk with your health care provider about what schedule of regular examinations is right for you. This is a good chance for you to check in with your provider about disease prevention and staying healthy. In between checkups, there are plenty of things you can do on your own. Experts have done a lot of research about which lifestyle changes and preventive measures are most likely to keep you healthy. Ask your health care provider for more information. Weight and diet Eat a healthy diet  Be sure to include plenty of vegetables, fruits, low-fat dairy products, and lean protein.  Do not eat a lot of foods high in solid fats, added sugars, or salt.  Get regular exercise. This is one of the most important things you can do for your health. ? Most adults should exercise for at least 150 minutes each week. The exercise should increase your heart rate and make you sweat (moderate-intensity exercise). ? Most adults should also do strengthening exercises at least twice a week. This is in addition to the moderate-intensity exercise. Maintain a healthy weight  Body mass index (BMI) is a measurement that can be used to identify possible weight problems. It estimates body fat based on height and weight. Your health care provider can help determine your BMI and help you achieve or maintain a healthy weight.  For females 20 years of age and older: ? A BMI below 18.5 is considered underweight. ? A BMI of 18.5 to 24.9 is normal. ? A BMI of 25 to 29.9 is considered overweight. ? A BMI of 30 and above is considered obese. Watch levels of cholesterol and blood lipids  You should start having your blood tested for lipids and cholesterol at 20 years of age, then have this test every 5 years.  You may need to have your cholesterol levels checked more often if: ? Your lipid or  cholesterol levels are high. ? You are older than 21 years of age. ? You are at high risk for heart disease. Cancer screening Lung Cancer  Lung cancer screening is recommended for adults 55-80 years old who are at high risk for lung cancer because of a history of smoking.  A yearly low-dose CT scan of the lungs is recommended for people who: ? Currently smoke. ? Have quit within the past 15 years. ? Have at least a 30-pack-year history of smoking. A pack year is smoking an average of one pack of cigarettes a day for 1 year.  Yearly screening should continue until it has been 15 years since you quit.  Yearly screening should stop if you develop a health problem that would prevent you from having lung cancer treatment. Breast Cancer  Practice breast self-awareness. This means understanding how your breasts normally appear and feel.  It also means doing regular breast self-exams. Let your health care provider know about any changes, no matter how small.  If you are in your 20s or 30s, you should have a clinical breast exam (CBE) by a health care provider every 1-3 years as part of a regular health exam.  If you are 40 or older, have a CBE every year. Also consider having a breast X-ray (mammogram) every year.  If you have a family history of breast cancer, talk to your health care provider about genetic screening.  If you are at high risk for breast cancer, talk   to your health care provider about having an MRI and a mammogram every year.  Breast cancer gene (BRCA) assessment is recommended for women who have family members with BRCA-related cancers. BRCA-related cancers include: ? Breast. ? Ovarian. ? Tubal. ? Peritoneal cancers.  Results of the assessment will determine the need for genetic counseling and BRCA1 and BRCA2 testing. Cervical Cancer Your health care provider may recommend that you be screened regularly for cancer of the pelvic organs (ovaries, uterus, and vagina).  This screening involves a pelvic examination, including checking for microscopic changes to the surface of your cervix (Pap test). You may be encouraged to have this screening done every 3 years, beginning at age 21.  For women ages 30-65, health care providers may recommend pelvic exams and Pap testing every 3 years, or they may recommend the Pap and pelvic exam, combined with testing for human papilloma virus (HPV), every 5 years. Some types of HPV increase your risk of cervical cancer. Testing for HPV may also be done on women of any age with unclear Pap test results.  Other health care providers may not recommend any screening for nonpregnant women who are considered low risk for pelvic cancer and who do not have symptoms. Ask your health care provider if a screening pelvic exam is right for you.  If you have had past treatment for cervical cancer or a condition that could lead to cancer, you need Pap tests and screening for cancer for at least 20 years after your treatment. If Pap tests have been discontinued, your risk factors (such as having a new sexual partner) need to be reassessed to determine if screening should resume. Some women have medical problems that increase the chance of getting cervical cancer. In these cases, your health care provider may recommend more frequent screening and Pap tests. Colorectal Cancer  This type of cancer can be detected and often prevented.  Routine colorectal cancer screening usually begins at 21 years of age and continues through 21 years of age.  Your health care provider may recommend screening at an earlier age if you have risk factors for colon cancer.  Your health care provider may also recommend using home test kits to check for hidden blood in the stool.  A small camera at the end of a tube can be used to examine your colon directly (sigmoidoscopy or colonoscopy). This is done to check for the earliest forms of colorectal cancer.  Routine  screening usually begins at age 50.  Direct examination of the colon should be repeated every 5-10 years through 21 years of age. However, you may need to be screened more often if early forms of precancerous polyps or small growths are found. Skin Cancer  Check your skin from head to toe regularly.  Tell your health care provider about any new moles or changes in moles, especially if there is a change in a mole's shape or color.  Also tell your health care provider if you have a mole that is larger than the size of a pencil eraser.  Always use sunscreen. Apply sunscreen liberally and repeatedly throughout the day.  Protect yourself by wearing long sleeves, pants, a wide-brimmed hat, and sunglasses whenever you are outside. Heart disease, diabetes, and high blood pressure  High blood pressure causes heart disease and increases the risk of stroke. High blood pressure is more likely to develop in: ? People who have blood pressure in the high end of the normal range (130-139/85-89 mm Hg). ? People   who are overweight or obese. ? People who are African American.  If you are 84-22 years of age, have your blood pressure checked every 3-5 years. If you are 67 years of age or older, have your blood pressure checked every year. You should have your blood pressure measured twice-once when you are at a hospital or clinic, and once when you are not at a hospital or clinic. Record the average of the two measurements. To check your blood pressure when you are not at a hospital or clinic, you can use: ? An automated blood pressure machine at a pharmacy. ? A home blood pressure monitor.  If you are between 52 years and 3 years old, ask your health care provider if you should take aspirin to prevent strokes.  Have regular diabetes screenings. This involves taking a blood sample to check your fasting blood sugar level. ? If you are at a normal weight and have a low risk for diabetes, have this test once  every three years after 20 years of age. ? If you are overweight and have a high risk for diabetes, consider being tested at a younger age or more often. Preventing infection Hepatitis B  If you have a higher risk for hepatitis B, you should be screened for this virus. You are considered at high risk for hepatitis B if: ? You were born in a country where hepatitis B is common. Ask your health care provider which countries are considered high risk. ? Your parents were born in a high-risk country, and you have not been immunized against hepatitis B (hepatitis B vaccine). ? You have HIV or AIDS. ? You use needles to inject street drugs. ? You live with someone who has hepatitis B. ? You have had sex with someone who has hepatitis B. ? You get hemodialysis treatment. ? You take certain medicines for conditions, including cancer, organ transplantation, and autoimmune conditions. Hepatitis C  Blood testing is recommended for: ? Everyone born from [birth year] through 1963. ? Anyone with known risk factors for hepatitis C. Sexually transmitted infections (STIs)  You should be screened for sexually transmitted infections (STIs) including gonorrhea and chlamydia if: ? You are sexually active and are younger than 21 years of age. ? You are older than 21 years of age and your health care provider tells you that you are at risk for this type of infection. ? Your sexual activity has changed since you were last screened and you are at an increased risk for chlamydia or gonorrhea. Ask your health care provider if you are at risk.  If you do not have HIV, but are at risk, it may be recommended that you take a prescription medicine daily to prevent HIV infection. This is called pre-exposure prophylaxis (PrEP). You are considered at risk if: ? You are sexually active and do not regularly use condoms or know the HIV status of your partner(s). ? You take drugs by injection. ? You are sexually active with a partner  who has HIV. Talk with your health care provider about whether you are at high risk of being infected with HIV. If you choose to begin PrEP, you should first be tested for HIV. You should then be tested every 3 months for as long as you are taking PrEP. Pregnancy  If you are premenopausal and you may become pregnant, ask your health care provider about preconception counseling.  If you may become pregnant, take 400 to 800 micrograms (mcg) of folic acid every  day.  If you want to prevent pregnancy, talk to your health care provider about birth control (contraception). Osteoporosis and menopause  Osteoporosis is a disease in which the bones lose minerals and strength with aging. This can result in serious bone fractures. Your risk for osteoporosis can be identified using a bone density scan.  If you are 74 years of age or older, or if you are at risk for osteoporosis and fractures, ask your health care provider if you should be screened.  Ask your health care provider whether you should take a calcium or vitamin D supplement to lower your risk for osteoporosis.  Menopause may have certain physical symptoms and risks.  Hormone replacement therapy may reduce some of these symptoms and risks. Talk to your health care provider about whether hormone replacement therapy is right for you. Follow these instructions at home:  Schedule regular health, dental, and eye exams.  Stay current with your immunizations.  Do not use any tobacco products including cigarettes, chewing tobacco, or electronic cigarettes.  If you are pregnant, do not drink alcohol.  If you are breastfeeding, limit how much and how often you drink alcohol.  Limit alcohol intake to no more than 1 drink per day for nonpregnant women. One drink equals 12 ounces of beer, 5 ounces of wine, or 1 ounces of hard liquor.  Do not use street drugs.  Do not share needles.  Ask your health care provider for help if you need support  or information about quitting drugs.  Tell your health care provider if you often feel depressed.  Tell your health care provider if you have ever been abused or do not feel safe at home. This information is not intended to replace advice given to you by your health care provider. Make sure you discuss any questions you have with your health care provider. Document Released: 10/28/2010 Document Revised: 09/20/2015 Document Reviewed: 01/16/2015 Elsevier Interactive Patient Education  2019 [place] (urticaria) are itchy, red, swollen areas on the skin. Hives can appear on any part of the body. Hives often fade within 24 hours (acute hives). Sometimes, new hives appear after old ones fade and the cycle can continue for several days or weeks (chronic hives). Hives do not spread from person to person (are not contagious). Hives come from the body's reaction to something a person is allergic to (allergen), something that causes irritation, or various other triggers. When a person is exposed to a trigger, his or her body releases a chemical (histamine) that causes redness, itching, and swelling. Hives can appear right after exposure to a trigger or hours later. What are the causes? This condition may be caused by:  Allergies to foods or ingredients.  Insect bites or stings.  Exposure to pollen or pets.  Contact with latex or chemicals.  Spending time in sunlight, heat, or cold (exposure).  Exercise.  Stress.  Certain medicines. You can also get hives from other medical conditions and treatments, such as:  Viruses, including the common cold.  Bacterial infections, such as urinary tract infections and strep throat.  Certain medicines.  Allergy shots.  Blood transfusions. Sometimes, the cause of this condition is not known (idiopathic hives). What increases the risk? You are more likely to develop this condition if you:  Are a woman.  Have food allergies,  especially to citrus fruits, milk, eggs, peanuts, tree nuts, or shellfish.  Are allergic to: ? Medicines. ? Latex. ? Insects. ? Animals. ? Pollen. What  are the signs or symptoms? Common symptoms of this condition include raised, itchy, red or white bumps or patches on your skin. These areas may:  Become large and swollen (welts).  Change in shape and location, quickly and repeatedly.  Be separate hives or connect over a large area of skin.  Sting or become painful.  Turn white when pressed in the center (blanch). In severe cases, yourhands, feet, and face may also become swollen. This may occur if hives develop deeper in your skin. How is this diagnosed? This condition may be diagnosed by your symptoms, medical history, and physical exam.  Your skin, urine, or blood may be tested to find out what is causing your hives and to rule out other health issues.  Your health care provider may also remove a small sample of skin from the affected area and examine it under a microscope (biopsy). How is this treated? Treatment for this condition depends on the cause and severity of your symptoms. Your health care provider may recommend using cool, wet cloths (cool compresses) or taking cool showers to relieve itching. Treatment may include:  Medicines that help: ? Relieve itching (antihistamines). ? Reduce swelling (corticosteroids). ? Treat infection (antibiotics).  An injectable medicine (omalizumab). Your health care provider may prescribe this if you have chronic idiopathic hives and you continue to have symptoms even after treatment with antihistamines. Severe cases may require an emergency injection of adrenaline (epinephrine) to prevent a life-threatening allergic reaction (anaphylaxis). Follow these instructions at home: Medicines  Take and apply over-the-counter and prescription medicines only as told by your health care provider.  If you were prescribed an antibiotic  medicine, take it as told by your health care provider. Do not stop using the antibiotic even if you start to feel better. Skin care  Apply cool compresses to the affected areas.  Do not scratch or rub your skin. General instructions  Do not take hot showers or baths. This can make itching worse.  Do not wear tight-fitting clothing.  Use sunscreen and wear protective clothing when you are outside.  Avoid any substances that cause your hives. Keep a journal to help track what causes your hives. Write down: ? What medicines you take. ? What you eat and drink. ? What products you use on your skin.  Keep all follow-up visits as told by your health care provider. This is important. Contact a health care provider if:  Your symptoms are not controlled with medicine.  Your joints are painful or swollen. Get help right away if:  You have a fever.  You have pain in your abdomen.  Your tongue or lips are swollen.  Your eyelids are swollen.  Your chest or throat feels tight.  You have trouble breathing or swallowing. These symptoms may represent a serious problem that is an emergency. Do not wait to see if the symptoms will go away. Get medical help right away. Call your local emergency services (911 in the [place] Do not drive yourself to the hospital. Summary  Hives (urticaria) are itchy, red, swollen areas on your skin. Hives come from the body's reaction to something a person is allergic to (allergen), something that causes irritation, or various other triggers.  Treatment for this condition depends on the cause and severity of your symptoms.  Avoid any substances that cause your hives. Keep a journal to help track what causes your hives.  Take and apply over-the-counter and prescription medicines only as told by your health care provider.  Keep all follow-up visits as told by your health care provider. This is important. This information is not intended to replace advice  given to you by your health care provider. Make sure you discuss any questions you have with your health care provider. Document Released: 04/14/2005 Document Revised: 10/28/2017 Document Reviewed: 10/28/2017 Elsevier Interactive Patient Education  2019 [employer].

## 2018-10-12 NOTE — Progress Notes (Signed)
Vickie Miller [birth year] [id number]    History:    Presents for annual exam.  Monthly cycle on Sprintec.  [place].  Gardasil series completed.  Past medical history, past surgical history, family history and social history were all reviewed and documented in the EPIC chart.  Adopted, minimal family history known.  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   ROS:  A ROS was performed and pertinent positives and negatives are included.  Exam:  Vitals:   10/12/18 1118  BP: 110/78  Weight: 94 lb (42.6 kg)  Height: 4\' 11"  (1.499 m)   Body mass index is 18.99 kg/m.   General appearance:  Normal Thyroid:  Symmetrical, normal in size, without palpable masses or nodularity. Respiratory  Auscultation:  Clear without wheezing or rhonchi Cardiovascular  Auscultation:  Regular rate, without rubs, murmurs or gallops  Edema/varicosities:  Not grossly evident Abdominal  Soft,nontender, without masses, guarding or rebound.  Liver/spleen:  No organomegaly noted  Hernia:  None appreciated  Skin  Inspection:  Grossly normal   Breasts: Examined lying and sitting.     Right: Without masses, retractions, discharge or axillary adenopathy.     Left: Without masses, retractions, discharge or axillary adenopathy. Gentitourinary   Inguinal/mons:  Normal without inguinal adenopathy  External genitalia:  Normal  BUS/Urethra/Skene's glands:  Normal  Vagina:  Normal  Cervix:  Normal  Uterus: normal in size, shape and contour.  Midline and mobile  Adnexa/parametria:     Rt: Without masses or tenderness.   Lt: Without masses or tenderness.  Anus and perineum: Normal    Assessment/Plan:  23 y.o. S [place] virgin for annual exam with no complaints.    Monthly cycle on Sprintec - good relief of dysmenorrhea ADD-primary care manages labs HSV-1 rare outbreaks  Plan: Sprintec prescription, proper use, slight risk for blood clots and strokes reviewed.  Reviewed  importance of condoms if becomes sexually active.  SBEs, exercise, calcium rich foods, MVI daily encouraged.  Campus safety reviewed.  CBC, TSH, Pap, new screening guidelines reviewed.    [place], 11:41 AM 10/12/2018

## 2018-10-13 LAB — PAP IG W/ RFLX HPV ASCU

## 2018-10-22 ENCOUNTER — Other Ambulatory Visit: Payer: Self-pay

## 2018-10-22 MED ORDER — AMPHETAMINE SULFATE 10 MG PO TABS: 10.0000 mg | ORAL_TABLET | Freq: Two times a day (BID) | ORAL | 0 refills | Status: DC

## 2018-10-22 NOTE — Telephone Encounter (Signed)
Patient call in for refill for [name]. Last visit 09/16/2018 next visit 12/20/2018. Please escribe to [facility] on [address] 

## 2018-10-22 NOTE — Telephone Encounter (Signed)
RX for above e-scribed and sent to pharmacy on record  [facility] - [place], [place] - [address] [place] [address] Phone: [phone] Fax: [phone]   

## 2018-11-19 ENCOUNTER — Other Ambulatory Visit: Payer: Self-pay | Admitting: Pediatrics

## 2018-11-19 DIAGNOSIS — F900 Attention-deficit hyperactivity disorder, predominantly inattentive type: Secondary | ICD-10-CM

## 2018-11-19 NOTE — Telephone Encounter (Signed)
Last visit 09/16/2018 next visit 12/20/2018

## 2018-11-19 NOTE — Telephone Encounter (Signed)
Intuniv 2 mg daily, # 90 with no RF's.RX for above e-scribed and sent to pharmacy on record  [id number] - [place], [place] - [address]  [place] [address] Phone: [phone] Fax: [phone]    

## 2018-11-23 ENCOUNTER — Other Ambulatory Visit: Payer: Self-pay

## 2018-11-23 NOTE — Telephone Encounter (Signed)
Patient call in for refill for [name]. Last visit 09/16/2018 next visit 12/20/2018. Please escribe to [facility] on [address]

## 2018-11-24 MED ORDER — AMPHETAMINE SULFATE 10 MG PO TABS: 10.0000 mg | ORAL_TABLET | Freq: Two times a day (BID) | ORAL | 0 refills | Status: DC

## 2018-11-24 NOTE — Telephone Encounter (Signed)
[name] 10 mg BID, # 60 with no RF's RX for above e-scribed and sent to pharmacy on record  [facility], [place] [address] Phone: [phone] Fax: [phone]

## 2018-12-20 ENCOUNTER — Encounter: Payer: Self-pay | Admitting: Family

## 2018-12-20 ENCOUNTER — Other Ambulatory Visit: Payer: Self-pay

## 2018-12-20 ENCOUNTER — Ambulatory Visit (INDEPENDENT_AMBULATORY_CARE_PROVIDER_SITE_OTHER): Payer: BC Managed Care – PPO | Admitting: Family

## 2018-12-20 DIAGNOSIS — Z79899 Other long term (current) drug therapy: Secondary | ICD-10-CM | POA: Diagnosis not present

## 2018-12-20 DIAGNOSIS — Z8659 Personal history of other mental and behavioral disorders: Secondary | ICD-10-CM

## 2018-12-20 DIAGNOSIS — F900 Attention-deficit hyperactivity disorder, predominantly inattentive type: Secondary | ICD-10-CM | POA: Diagnosis not present

## 2018-12-20 DIAGNOSIS — Z7189 Other specified counseling: Secondary | ICD-10-CM

## 2018-12-20 DIAGNOSIS — Z719 Counseling, unspecified: Secondary | ICD-10-CM | POA: Diagnosis not present

## 2018-12-20 DIAGNOSIS — R278 Other lack of coordination: Secondary | ICD-10-CM

## 2018-12-20 MED ORDER — AMPHETAMINE SULFATE 10 MG PO TABS: 10.0000 mg | ORAL_TABLET | Freq: Two times a day (BID) | ORAL | 0 refills | Status: DC

## 2018-12-20 NOTE — Progress Notes (Signed)
Medication Check visit via Virtual Video due to Miller  Patient ID:  Vickie MooreFlorentina Miller  female DOB: 1995/07/18   23 y.o.   MRN: [id number]   DATE:12/20/18  PCP: Merri BrunettePharr, Walter, MD  Virtual Visit via Video Note  I connected with  Vickie Miller on 12/20/18 at  9:00 AM EDT by a video enabled telemedicine application and verified that I am speaking with the correct person using two identifiers. Patient/Parent Location: at home.   I discussed the limitations, risks, security and privacy concerns of performing an evaluation and management service by telephone and the availability of in person appointments. I also discussed with the parents that there may be a patient responsible charge related to this service. The parents expressed understanding and agreed to proceed.  Provider: Carron Curieawn M Paretta-Leahey, NP  Location: private location  HISTORY/CURRENT STATUS: Vickie Miller is here for medication management of the psychoactive medications for ADHD and review of educational and behavioral concerns.   Vickie Miller, which is working Miller. Takes medication in the morning.  Medication tends to wear off around dinner time. Vickie Miller.   Vickie Miller (eating breakfast, lunch and dinner). Eating Miller during the day.   Sleeping Miller (getting enough sleep), sleeping through the night.   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   Activities/ Exercise: intermittently  Screen time: (phone, tablet, TV, computer): computer for school.   MEDICAL HISTORY: Individual Medical History/ Review of Systems: Changes? :Yes, had seen PCP for  allergies and Rx for cream.   Family Medical/ Social History: Changes? None  Patient Lives with: parents  Current Medications:  Current Outpatient Medications on File Prior to Visit  Medication Sig Dispense Refill  . guanFACINE (Miller) 2 MG TB24 ER tablet TAKE ONE TABLET BY MOUTH DAILY 90 tablet 0  . SPRINTEC 28 0.25-35 MG-MCG tablet TAKE 1 TABLET BY MOUTH EVERY DAY 84 tablet 0  . acyclovir (ZOVIRAX) 400 MG tablet Take 1 tablet 5 times daily as needed for 3 to 5 days. (Patient not taking: Reported on 12/20/2018) 30 tablet 1   No current facility-administered medications on file prior to visit.    Medication Side Effects: None  MENTAL HEALTH: Mental Health Issues:   Anxiety history of some anxiety, no issues with symptom control.  DIAGNOSES:    ICD-10-CM   1. ADHD (attention deficit hyperactivity disorder), inattentive type  F90.0   2. Patient counseled  Z71.9   3. Medication management  Z79.899   4. Goals of care, counseling/discussion  Z71.89   5. Dysgraphia  R27.8   6. History of anxiety  Z86.59     RECOMMENDATIONS:  Discussed recent history with patient for health, learning and school since last visit.   Discussed school academic progress and recommended continued appropriate accommodations for the new school year with remote learning this semester.   Information obtained from patient related to transitioning from [employer] this semester.   Discussed continued need for routine, structure, and motivation this semester with all online classes.   Encouraged recommended limitations on TV, tablets, phones, video games and computers for non-educational activities.   Discussed need for bedtime routine, use of good sleep hygiene, no video games,  TV or phones for an hour before bedtime.   Encouraged physical activity and outdoor exercise, maintaining social distancing.   Counseled medication pharmacokinetics, options, dosage, administration, desired effects,  and possible side effects.   [name] 10 mg BID, # 180 with no RF's for 3 month supply Intuiv 2 mg daily, no Rx today RX for above e-scribed and sent to pharmacy on record  [place], [place] [address] Phone: [phone] Fax: [phone]   I discussed the assessment and treatment plan with the patient. The patient was provided an opportunity to ask questions and all were answered. The patient agreed with the plan and demonstrated an understanding of the instructions.   I provided 25 minutes of non-face-to-face time during this encounter.   Completed record review for 10 minutes prior to the virtual video visit.   NEXT APPOINTMENT:  Return in about 3 months (around 03/22/2019) for follow up visit.  The patient was advised to call back or seek an in-person evaluation if the symptoms worsen or if the condition fails to improve as anticipated.  Medical Decision-making: More than 50% of the appointment was spent counseling and discussing diagnosis and management of symptoms with the patient and family.  Carolann Littler, NP

## 2018-12-27 ENCOUNTER — Other Ambulatory Visit: Payer: Self-pay

## 2018-12-27 DIAGNOSIS — Z3041 Encounter for surveillance of contraceptive pills: Secondary | ICD-10-CM

## 2018-12-27 MED ORDER — NORGESTIMATE-ETH ESTRADIOL 0.25-35 MG-MCG PO TABS: 1.0000 | ORAL_TABLET | Freq: Every day | ORAL | 3 refills | Status: DC

## 2019-02-28 ENCOUNTER — Other Ambulatory Visit: Payer: Self-pay

## 2019-02-28 DIAGNOSIS — F900 Attention-deficit hyperactivity disorder, predominantly inattentive type: Secondary | ICD-10-CM

## 2019-02-28 MED ORDER — GUANFACINE HCL ER 2 MG PO TB24: 2.0000 mg | ORAL_TABLET | Freq: Every day | ORAL | 0 refills | Status: DC

## 2019-02-28 NOTE — Telephone Encounter (Signed)
Mom called in for refill for Intuniv. Last visit 12/20/2018 next visit 03/21/2019. Please to [facility] on [address]

## 2019-02-28 NOTE — Telephone Encounter (Signed)
E-Prescribed Intuniv 2 mg directly to  [facility] [address] [place] [address] Phone: [phone] Fax: [phone]

## 2019-03-21 ENCOUNTER — Ambulatory Visit (INDEPENDENT_AMBULATORY_CARE_PROVIDER_SITE_OTHER): Payer: BC Managed Care – PPO | Admitting: Family

## 2019-03-21 ENCOUNTER — Encounter: Payer: Self-pay | Admitting: Family

## 2019-03-21 DIAGNOSIS — Z7189 Other specified counseling: Secondary | ICD-10-CM

## 2019-03-21 DIAGNOSIS — R480 Dyslexia and alexia: Secondary | ICD-10-CM | POA: Diagnosis not present

## 2019-03-21 DIAGNOSIS — Z719 Counseling, unspecified: Secondary | ICD-10-CM

## 2019-03-21 DIAGNOSIS — F900 Attention-deficit hyperactivity disorder, predominantly inattentive type: Secondary | ICD-10-CM

## 2019-03-21 DIAGNOSIS — Z79899 Other long term (current) drug therapy: Secondary | ICD-10-CM

## 2019-03-21 DIAGNOSIS — Z8659 Personal history of other mental and behavioral disorders: Secondary | ICD-10-CM

## 2019-03-21 DIAGNOSIS — F819 Developmental disorder of scholastic skills, unspecified: Secondary | ICD-10-CM | POA: Diagnosis not present

## 2019-03-21 MED ORDER — AMPHETAMINE SULFATE 10 MG PO TABS: 10.0000 mg | ORAL_TABLET | Freq: Two times a day (BID) | ORAL | 0 refills | Status: DC

## 2019-03-21 NOTE — Progress Notes (Signed)
[facility]. [address] [place] [address] Dept: [phone] Dept Fax: [phone]  Medication Check visit via Virtual Video due to COVID-19  Patient ID:  Vickie Miller  female DOB: 1996/01/07   23 y.o.   MRN: [id number]   DATE:03/21/19  PCP: Deland Pretty, MD  Virtual Visit via Video Note  I connected with  Vickie Miller on 03/21/19 at  3:00 PM EST by a video enabled telemedicine application and verified that I am speaking with the correct person using two identifiers. Patient/Parent Location: at home   I discussed the limitations, risks, security and privacy concerns of performing an evaluation and management service by telephone and the availability of in person appointments. I also discussed with the parents that there may be a patient responsible charge related to this service. The parents expressed understanding and agreed to proceed.  Provider: Carolann Littler, NP  Location: private location  HISTORY/CURRENT STATUS: Vickie Miller is here for medication management of the psychoactive medications for ADHD and review of educational and behavioral concerns.   Vickie Miller currently taking [name] and Intuniv,  which is working well. Takes medication [name] and Intuniv. Medication tends to wear off around evening. Vickie Miller is able to focus through school/homework.   Vickie Miller is eating well (eating breakfast, lunch and dinner). Eating better now with recent increase in daily calories.   Sleeping well (getting enough sleep), sleeping through the night.   EDUCATION: School: [employer]  Year/Grade: college transferPerformance/ Grades: above average Services: IEP/504 Plan  Vickie Miller is currently in distance learning due to social distancing due to COVID-19 and will continue for at least: for the first part of the school year.   Activities/ Exercise:  intermittently  Screen time: (phone, tablet, TV, computer): computer, tv, phone and movies.   MEDICAL HISTORY: Individual Medical History/ Review of Systems: Changes? :None reported recently  Family Medical/ Social History: Changes? No Patient Lives with: parents  Current Medications:  Current Outpatient Medications on File Prior to Visit  Medication Sig Dispense Refill  . guanFACINE (INTUNIV) 2 MG TB24 ER tablet Take 1 tablet (2 mg total) by mouth daily. 90 tablet 0  . norgestimate-ethinyl estradiol (SPRINTEC 28) 0.25-35 MG-MCG tablet Take 1 tablet by mouth daily. 84 tablet 3  . acyclovir (ZOVIRAX) 400 MG tablet Take 1 tablet 5 times daily as needed for 3 to 5 days. (Patient not taking: Reported on 12/20/2018) 30 tablet 1   No current facility-administered medications on file prior to visit.     Medication Side Effects: None  MENTAL HEALTH: Mental Health Issues:   Anxiety    DIAGNOSES:    ICD-10-CM   1. ADHD (attention deficit hyperactivity disorder), inattentive type  F90.0   2. Dyslexia  R48.0   3. Problems with learning  F81.9   4. History of anxiety  Z86.59   5. Medication management  Z79.899   6. Patient counseled  Z71.9   7. Goals of care, counseling/discussion  Z71.89     RECOMMENDATIONS:  Discussed recent history with patient/parent  Discussed school academic progress and recommended continued accommodations for the new school year.  Referred to [employer] for resources about using distance learning with children with ADHD  Children and young adults with ADHD often suffer from disorganization, difficulty with time management, completing projects and other executive function difficulties.  Recommended Reading: "Smart but Scattered" and "Smart but Scattered Teens" by Peg Renato Battles and Ethelene Browns.  Discussed continued need for structure, routine, reward (external), motivation (internal), positive reinforcement, consequences, and organization  Encouraged  recommended limitations on TV, tablets, phones, video games and computers for non-educational activities.   Discussed need for bedtime routine, use of good sleep hygiene, no video games, TV or phones for an hour before bedtime.   Encouraged physical activity and outdoor play, maintaining social distancing.   Counseled medication pharmacokinetics, options, dosage, administration, desired effects, and possible side effects.   Intuniv 2 mg daily, no Rx today [name] 10 mg 2 BID, # 180 with no RF's RX for above e-scribed and sent to pharmacy on record  [facility] [address] [place], [place] - [address] [place] [address] Phone: [phone] Fax: [phone]  I discussed the assessment and treatment plan with the patient. The patient was provided an opportunity to ask questions and all were answered. The patient agreed with the plan and demonstrated an understanding of the instructions.   I provided 25 minutes of non-face-to-face time during this encounter. Completed record review for 10 minutes prior to the virtual video visit.   NEXT APPOINTMENT:  Return in about 3 months (around 06/21/2019) for follow up visit.  The patient was advised to call back or seek an in-person evaluation if the symptoms worsen or if the condition fails to improve as anticipated.  Medical Decision-making: More than 50% of the appointment was spent counseling and discussing diagnosis and management of symptoms with the patient and family.  Carron Curie, NP

## 2019-06-13 ENCOUNTER — Other Ambulatory Visit: Payer: Self-pay | Admitting: Pediatrics

## 2019-06-13 DIAGNOSIS — F900 Attention-deficit hyperactivity disorder, predominantly inattentive type: Secondary | ICD-10-CM

## 2019-06-13 NOTE — Telephone Encounter (Signed)
Intuniv 2 mg daily, # 90 with no RF's.RX for above e-scribed and sent to pharmacy on record  [facility] [address], [place] - [address] [place] [address] Phone: [phone] Fax: [phone]

## 2019-06-14 ENCOUNTER — Other Ambulatory Visit: Payer: Self-pay

## 2019-06-14 MED ORDER — GUANFACINE HCL ER 2 MG PO TB24: 2.0000 mg | ORAL_TABLET | Freq: Every day | ORAL | 0 refills | Status: DC

## 2019-06-14 NOTE — Addendum Note (Signed)
Addended by: Carron Curie on: 06/14/2019 09:38 AM   Modules accepted: Orders

## 2019-06-14 NOTE — Telephone Encounter (Signed)
Pharm faxed in that [facility] [place], [place]

## 2019-06-14 NOTE — Telephone Encounter (Signed)
Intuniv 2 mg # 90 with no RF's resent to pharmacy in [place]. RX for above e-scribed and sent to pharmacy on record  [id number] [place], [place] - [address] [place] [address] Phone: [phone] Fax: [phone]

## 2019-06-14 NOTE — Addendum Note (Signed)
Addended by: Burgess Estelle on: 06/14/2019 08:53 AM   Modules accepted: Orders

## 2019-06-14 NOTE — Telephone Encounter (Signed)
Mom stopped by office for refill for [name]. Last visit 03/21/2019 next visit 07/01/2019. Please escribe to [facility] on [address]

## 2019-06-15 MED ORDER — AMPHETAMINE SULFATE 10 MG PO TABS: 10.0000 mg | ORAL_TABLET | Freq: Two times a day (BID) | ORAL | 0 refills | Status: DC

## 2019-06-15 NOTE — Telephone Encounter (Signed)
Clarified Rx with provider E-Prescribed [name] 10 mg BID directly to  [id number] [place], [place] - [address] [place] [address] Phone: [phone] Fax: [phone]

## 2019-06-15 NOTE — Telephone Encounter (Deleted)
E-Prescribed [name] 10 directly to  [id number] [place], [place] - [address] [place] [address] Phone: [phone] Fax: [phone]   

## 2019-06-21 ENCOUNTER — Other Ambulatory Visit: Payer: Self-pay

## 2019-06-21 MED ORDER — EVEKEO ODT 20 MG PO TBDP: 20.0000 mg | ORAL_TABLET | Freq: Every day | ORAL | 0 refills | Status: DC

## 2019-06-21 NOTE — Telephone Encounter (Signed)
Mom called in stating that she mailed off [name] 10mg  but it might not get to patient before she runs out. Spoke with patient and let her know that we are going to send in 7 days of [name] ODT 20mg  [facility] [place], 

## 2019-06-21 NOTE — Telephone Encounter (Signed)
Patient called for an emergency 7 day supply of [name] with change to 20 mg ODT until her regular Rx for [name] 10 mg 2 daily arrives in the mail. Rx sent to [facility] per request. RX for above e-scribed and sent to pharmacy on record  [id number] [place], [place] - [address] [place] [address] Phone: [phone] Fax: [phone]

## 2019-06-23 MED ORDER — AMPHETAMINE SULFATE 10 MG PO TABS: 10.0000 mg | ORAL_TABLET | Freq: Two times a day (BID) | ORAL | 0 refills | Status: DC

## 2019-06-23 NOTE — Addendum Note (Signed)
Addended by: Carron Curie on: 06/23/2019 01:41 PM   Modules accepted: Orders

## 2019-06-23 NOTE — Telephone Encounter (Signed)
Mom called in stating that [facility] [place] [place] does not have the [name] ODT in stock as of 06/23/2019. Informed mom that we can send [name] 10mg  for 7 days but she will have to pay out of pocket mom said that is fine.

## 2019-06-23 NOTE — Addendum Note (Signed)
Addended by: Burgess Estelle on: 06/23/2019 11:12 AM   Modules accepted: Orders

## 2019-06-23 NOTE — Telephone Encounter (Signed)
Mother called back to have Vickie Miller 10 mg 2 daily for 3 month supply to be sent to a pharmacy in [place]. RX for above e-scribed and sent to pharmacy on record  [id number] [place], [place] - [address] [place] [address] Phone: [phone] Fax: [phone]   x

## 2019-07-01 ENCOUNTER — Encounter: Payer: Self-pay | Admitting: Family

## 2019-07-01 ENCOUNTER — Ambulatory Visit (INDEPENDENT_AMBULATORY_CARE_PROVIDER_SITE_OTHER): Payer: 59 | Admitting: Family

## 2019-07-01 ENCOUNTER — Other Ambulatory Visit: Payer: Self-pay

## 2019-07-01 DIAGNOSIS — R278 Other lack of coordination: Secondary | ICD-10-CM | POA: Insufficient documentation

## 2019-07-01 DIAGNOSIS — Z8659 Personal history of other mental and behavioral disorders: Secondary | ICD-10-CM | POA: Diagnosis not present

## 2019-07-01 DIAGNOSIS — Z79899 Other long term (current) drug therapy: Secondary | ICD-10-CM | POA: Diagnosis not present

## 2019-07-01 DIAGNOSIS — F900 Attention-deficit hyperactivity disorder, predominantly inattentive type: Secondary | ICD-10-CM | POA: Diagnosis not present

## 2019-07-01 DIAGNOSIS — Z719 Counseling, unspecified: Secondary | ICD-10-CM

## 2019-07-01 DIAGNOSIS — R480 Dyslexia and alexia: Secondary | ICD-10-CM

## 2019-07-01 DIAGNOSIS — Z7189 Other specified counseling: Secondary | ICD-10-CM

## 2019-07-01 NOTE — Progress Notes (Signed)
[facility]. [address] [place] [address] Dept: [phone] Dept Fax: [phone]  Medication Check visit via Virtual Video due to COVID-19  Patient ID:  Vickie Miller  female DOB: 1995-09-30   23 y.o.   MRN: [id number]   DATE:07/01/19  PCP: Deland Pretty, MD  Virtual Visit via Video Note  I connected with  Ronney Asters on 07/01/19 at  2:00 PM EST by a video enabled telemedicine application and verified that I am speaking with the correct person using two identifiers. Patient/Parent Location: at school.    I discussed the limitations, risks, security and privacy concerns of performing an evaluation and management service by telephone and the availability of in person appointments. I also discussed with the parents that there may be a patient responsible charge related to this service. The parents expressed understanding and agreed to proceed.  Provider: Carolann Littler, NP  Location: private location.   HISTORY/CURRENT STATUS: Vickie Miller is here for medication management of the psychoactive medications for ADHD and review of educational and behavioral concerns.   Nicolle currently taking [name] and Intuniv,  which is working well. Takes medication as directed. Medication tends to last for the time needed. Shamikia is able to focus through school/homework.   Starlette is eating well (eating breakfast, lunch and dinner). Eating better with meals and eating better at school.   Sleeping well (getting enough), sleeping through the night. Good routine each night with 8 hours most nights.   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   Activities/ Exercise: intermittently-walking  Screen time: (phone, tablet, TV, computer): computer for learning, phone, TV and movies.   MEDICAL HISTORY: Individual Medical History/ Review of Systems: Changes? :No  Family Medical/ Social History: Changes? None reported recently Patient Lives with: 1 roommate in apartment  Current Medications:  Current Outpatient Medications on File Prior to Visit  Medication Sig Dispense Refill  . Amphetamine Sulfate ([name]) 10 MG TABS Take 10 mg by mouth 2 (two) times daily. 3 month supply Supervisor: Hampton Abbot DEA:  [id number] NPI:  [id number] NCID: [id number] 14 tablet 0  . guanFACINE (INTUNIV) 2 MG TB24 ER tablet Take 1 tablet (2 mg total) by mouth daily. 90 tablet 0  . norgestimate-ethinyl estradiol (SPRINTEC 28) 0.25-35 MG-MCG tablet Take 1 tablet by mouth daily. 84 tablet 3  . acyclovir (ZOVIRAX) 400 MG tablet Take 1 tablet 5 times daily as needed for 3 to 5 days. (Patient not taking: Reported on 12/20/2018) 30 tablet 1   No current facility-administered medications on file prior to visit.    Medication Side Effects: None  MENTAL HEALTH: Mental Health Issues:   Anxiety-less now with medications    DIAGNOSES:    ICD-10-CM   1. ADHD (attention deficit hyperactivity disorder), inattentive type  F90.0   2. History of anxiety  Z86.59   3. Dyslexia  R48.0   4. Medication management  Z79.899   5. Patient counseled  Z71.9   6. Goals of care, counseling/discussion  Z71.89     RECOMMENDATIONS:  Discussed recent history with patient with updates for school, learning, academics, health and medications  Discussed school academic progress and recommended continued accommodations as needed for school/learning.   Discussed health and current  weight. Recommended healthy food choices, watching portion sizes, avoiding second helpings, avoiding sugary drinks like soda and tea, drinking more water, getting more exercise.    Discussed continued need for structure, routine, reward (external), motivation (internal), positive reinforcement, consequences, and organization with school, living and social settings.   Encouraged recommended limitations on TV, tablets, phones, video games and computers for non-educational activities.   Discussed need for bedtime routine, use of good sleep hygiene, no video games, TV or phones for an hour before bedtime.   Encouraged physical activity and outdoor play, maintaining social distancing.   Counseled medication pharmacokinetics, options, dosage, administration, desired effects, and possible side effects.   [name] 10 mg 2 daily no Rx today Intuniv 2 mg daily, no Rx today RX for above e-scribed and sent to pharmacy on record  [id number] [place], [place] - [address] [place] [address] Phone: [phone] Fax: [phone]  I discussed the assessment and treatment plan with the patient & parent. The patient & parent was provided an opportunity to ask questions and all were answered. The patient & parent agreed with the plan and demonstrated an understanding of the instructions.   I provided 25 minutes of non-face-to-face time during this encounter. Completed record review for 10 minutes prior to the virtual video visit.   NEXT APPOINTMENT:  Return in about 3 months (around 10/01/2019) for follow up visit .  The patient & parent was advised to call back or seek an in-person evaluation if the symptoms worsen or if the condition fails to improve as anticipated.  Medical Decision-making: More than 50% of the appointment was spent counseling and discussing diagnosis and management of symptoms with the patient and family.  Carron Curie, NP

## 2019-09-06 ENCOUNTER — Other Ambulatory Visit: Payer: Self-pay | Admitting: Family

## 2019-09-06 DIAGNOSIS — F900 Attention-deficit hyperactivity disorder, predominantly inattentive type: Secondary | ICD-10-CM

## 2019-09-07 NOTE — Telephone Encounter (Signed)
Intuniv 2 mg daily, # 90 with no RF's RX for above e-scribed and sent to pharmacy on record  [id number] [place] [place] - [address] [place] [address] Phone: [phone] Fax: [phone]

## 2019-09-19 ENCOUNTER — Other Ambulatory Visit: Payer: Self-pay

## 2019-09-19 MED ORDER — AMPHETAMINE SULFATE 10 MG PO TABS: 10.0000 mg | ORAL_TABLET | Freq: Two times a day (BID) | ORAL | 0 refills | Status: DC

## 2019-09-19 NOTE — Telephone Encounter (Signed)
Patient called in for refill for [name]. Last visit 07/01/2019 next visit 10/17/2019. Please escribe to [facility] [address]

## 2019-09-19 NOTE — Telephone Encounter (Signed)
[name] 10 mg BID, # 180 with no RF's 3 month supply

## 2019-10-14 ENCOUNTER — Other Ambulatory Visit: Payer: Self-pay

## 2019-10-17 ENCOUNTER — Encounter: Payer: Self-pay | Admitting: Family

## 2019-10-17 ENCOUNTER — Encounter: Payer: Self-pay | Admitting: Nurse Practitioner

## 2019-10-17 ENCOUNTER — Ambulatory Visit (INDEPENDENT_AMBULATORY_CARE_PROVIDER_SITE_OTHER): Payer: BC Managed Care – PPO | Admitting: Family

## 2019-10-17 ENCOUNTER — Ambulatory Visit (INDEPENDENT_AMBULATORY_CARE_PROVIDER_SITE_OTHER): Payer: 59 | Admitting: Nurse Practitioner

## 2019-10-17 ENCOUNTER — Other Ambulatory Visit: Payer: Self-pay

## 2019-10-17 VITALS — BP 112/64 | HR 72 | Resp 16 | Ht 60.0 in | Wt 93.2 lb

## 2019-10-17 VITALS — BP 110/78 | Ht 59.0 in | Wt 95.0 lb

## 2019-10-17 DIAGNOSIS — Z719 Counseling, unspecified: Secondary | ICD-10-CM

## 2019-10-17 DIAGNOSIS — F900 Attention-deficit hyperactivity disorder, predominantly inattentive type: Secondary | ICD-10-CM

## 2019-10-17 DIAGNOSIS — Z01419 Encounter for gynecological examination (general) (routine) without abnormal findings: Secondary | ICD-10-CM | POA: Diagnosis not present

## 2019-10-17 DIAGNOSIS — Z8659 Personal history of other mental and behavioral disorders: Secondary | ICD-10-CM

## 2019-10-17 DIAGNOSIS — Z79899 Other long term (current) drug therapy: Secondary | ICD-10-CM

## 2019-10-17 DIAGNOSIS — Z3041 Encounter for surveillance of contraceptive pills: Secondary | ICD-10-CM

## 2019-10-17 DIAGNOSIS — Z7189 Other specified counseling: Secondary | ICD-10-CM

## 2019-10-17 NOTE — Patient Instructions (Signed)
Health Maintenance, Female Adopting a healthy lifestyle and getting preventive care are important in promoting health and wellness. Ask your health care provider about:  The right schedule for you to have regular tests and exams.  Things you can do on your own to prevent diseases and keep yourself healthy. What should I know about diet, weight, and exercise? Eat a healthy diet   Eat a diet that includes plenty of vegetables, fruits, low-fat dairy products, and lean protein.  Do not eat a lot of foods that are high in solid fats, added sugars, or sodium. Maintain a healthy weight Body mass index (BMI) is used to identify weight problems. It estimates body fat based on height and weight. Your health care provider can help determine your BMI and help you achieve or maintain a healthy weight. Get regular exercise Get regular exercise. This is one of the most important things you can do for your health. Most adults should:  Exercise for at least 150 minutes each week. The exercise should increase your heart rate and make you sweat (moderate-intensity exercise).  Do strengthening exercises at least twice a week. This is in addition to the moderate-intensity exercise.  Spend less time sitting. Even light physical activity can be beneficial. Watch cholesterol and blood lipids Have your blood tested for lipids and cholesterol at 22 years of age, then have this test every 5 years. Have your cholesterol levels checked more often if:  Your lipid or cholesterol levels are high.  You are older than 22 years of age.  You are at high risk for heart disease. What should I know about cancer screening? Depending on your health history and family history, you may need to have cancer screening at various ages. This may include screening for:  Breast cancer.  Cervical cancer.  Colorectal cancer.  Skin cancer.  Lung cancer. What should I know about heart disease, diabetes, and high blood  pressure? Blood pressure and heart disease  High blood pressure causes heart disease and increases the risk of stroke. This is more likely to develop in people who have high blood pressure readings, are of African descent, or are overweight.  Have your blood pressure checked: ? Every 3-5 years if you are 18-39 years of age. ? Every year if you are 40 years old or older. Diabetes Have regular diabetes screenings. This checks your fasting blood sugar level. Have the screening done:  Once every three years after age 40 if you are at a normal weight and have a low risk for diabetes.  More often and at a younger age if you are overweight or have a high risk for diabetes. What should I know about preventing infection? Hepatitis B If you have a higher risk for hepatitis B, you should be screened for this virus. Talk with your health care provider to find out if you are at risk for hepatitis B infection. Hepatitis C Testing is recommended for:  Everyone born from 1945 through 1965.  Anyone with known risk factors for hepatitis C. Sexually transmitted infections (STIs)  Get screened for STIs, including gonorrhea and chlamydia, if: ? You are sexually active and are younger than 22 years of age. ? You are older than 22 years of age and your health care provider tells you that you are at risk for this type of infection. ? Your sexual activity has changed since you were last screened, and you are at increased risk for chlamydia or gonorrhea. Ask your health care provider if   you are at risk.  Ask your health care provider about whether you are at high risk for HIV. Your health care provider may recommend a prescription medicine to help prevent HIV infection. If you choose to take medicine to prevent HIV, you should first get tested for HIV. You should then be tested every 3 months for as long as you are taking the medicine. Pregnancy  If you are about to stop having your period (premenopausal) and  you may become pregnant, seek counseling before you get pregnant.  Take 400 to 800 micrograms (mcg) of folic acid every day if you become pregnant.  Ask for birth control (contraception) if you want to prevent pregnancy. Osteoporosis and menopause Osteoporosis is a disease in which the bones lose minerals and strength with aging. This can result in bone fractures. If you are 65 years old or older, or if you are at risk for osteoporosis and fractures, ask your health care provider if you should:  Be screened for bone loss.  Take a calcium or vitamin D supplement to lower your risk of fractures.  Be given hormone replacement therapy (HRT) to treat symptoms of menopause. Follow these instructions at home: Lifestyle  Do not use any products that contain nicotine or tobacco, such as cigarettes, e-cigarettes, and chewing tobacco. If you need help quitting, ask your health care provider.  Do not use street drugs.  Do not share needles.  Ask your health care provider for help if you need support or information about quitting drugs. Alcohol use  Do not drink alcohol if: ? Your health care provider tells you not to drink. ? You are pregnant, may be pregnant, or are planning to become pregnant.  If you drink alcohol: ? Limit how much you use to 0-1 drink a day. ? Limit intake if you are breastfeeding.  Be aware of how much alcohol is in your drink. In the [place] one drink equals one 12 oz bottle of beer (355 mL), one 5 oz glass of wine (148 mL), or one 1 oz glass of hard liquor (44 mL). General instructions  Schedule regular health, dental, and eye exams.  Stay current with your vaccines.  Tell your health care provider if: ? You often feel depressed. ? You have ever been abused or do not feel safe at home. Summary  Adopting a healthy lifestyle and getting preventive care are important in promoting health and wellness.  Follow your health care provider's instructions about healthy  diet, exercising, and getting tested or screened for diseases.  Follow your health care provider's instructions on monitoring your cholesterol and blood pressure. This information is not intended to replace advice given to you by your health care provider. Make sure you discuss any questions you have with your health care provider. Document Revised: 04/07/2018 Document Reviewed: 04/07/2018 Elsevier Patient Education  2020 Elsevier Inc.  

## 2019-10-17 NOTE — Progress Notes (Signed)
[facility] [address] [place] [address] Dept: [phone] Dept Fax: [phone] Loc: [phone] Loc Fax: [phone]  Medication Check  Patient ID: Vickie Miller, female  DOB: [birth year], 24 y.o.  MRN: [id number]  Date of Evaluation: 10/17/2019 PCP: Deland Pretty, MD  Accompanied by: self Patient Lives with: parents at home  HISTORY/CURRENT STATUS: HPI Patient here by herself for the visit today. Patient interactive and appropriate with provider today. Patient did well last semester and home for the summer taking 2 classes. Will move into the same apartment complex with same roommate next year in [place]. Vickie Miller reports f/u visits for routine health care recently with no changes reported. She has continued to take her medication regimen as previously with no reported side effects.   EDUCATION: School: [employer] Year/Grade: transfer student  Performance/ Grades: average Services: Other: Disability Services  Activities/ Exercise: intermittently  MEDICAL HISTORY: Appetite: Eating meals but not as good for calories while home MVI/Other: None Variety of foods in some days  Sleep:Getting plenty of sleep Concerns: Initiation/Maintenance/Other: None  Individual Medical History/ Review of Systems: Changes? :Yes, GYN appt this morning and PCP yearly visit.   Allergies: Patient has no known allergies.  Current Medications: Current Outpatient Medications  Medication Instructions   acyclovir (ZOVIRAX) 400 MG tablet Take 1 tablet 5 times daily as needed for 3 to 5 days.   Amphetamine Sulfate ([name]) 10 mg, Oral, 2 times daily, 3 month supply Supervisor: Hampton Abbot DEA:  [id number] NPI:  [id number] NCID: [id number]   guanFACINE (INTUNIV) 2 MG TB24 ER tablet TAKE 1 TABLET BY MOUTH EVERY DAY    norgestimate-ethinyl estradiol (SPRINTEC 28) 0.25-35 MG-MCG tablet 1 tablet, Oral, Daily   Medication Side Effects: None  Family Medical/ Social History: Changes? None reported  MENTAL HEALTH: Mental Health Issues: Anxiety-history of anxiety but no concerns   PHYSICAL EXAM; Vitals:  Vitals:   10/17/19 1132  BP: 112/64  Pulse: 72  Resp: 16  Height: 5' (1.524 m)  Weight: 93 lb 3.2 oz (42.3 kg)  BMI (Calculated): 18.2   General Physical Exam: Unchanged from previous exam, date: 07/01/2019 Changed:None reported today  DIAGNOSES:    ICD-10-CM   1. ADHD (attention deficit hyperactivity disorder), inattentive type  F90.0   2. History of anxiety  Z86.59   3. Medication management  Z79.899   4. Patient counseled  Z71.9   5. Goals of care, counseling/discussion  Z71.89    RECOMMENDATIONS:  Counseling at this visit included the review of old records and/or current chart with the patient with updates for school, college living, classes this semester, health and medication.   Discussed recent history and today's examination with patient with no changes on exam today.   Counseled regarding heath and updates for medical f/u recently.   Recommended a high protein, low sugar diet, avoid sugary snacks and drinks, drink more water, eat more fruits and vegetables, increase daily exercise.  Encourage calorie dense foods when hungry. Encourage snacks in the afternoon/evening. Add calories to food being consumed like switching to whole milk products, using instant breakfast type powders, increasing calories of foods with butter, sour cream, mayonnaise, cheese or ranch dressing. Can add potato flakes or powdered milk.   Discussed school academic and behavioral progress and advocated for appropriate accommodations as needed for learning support on campus.   Discussed importance of maintaining structure, routine, organization, reward, motivation and consequences  with consistency with home, school and  social settings.   Counseled medication pharmacokinetics, options, dosage, administration, desired effects, and possible side effects.   [name] 10 mg BID, no Rx today Intuniv 2 mg daily, no Rx today  Advised importance of:  Good sleep hygiene (8- 10 hours per night, no TV or video games for 1 hour before bedtime) Limited screen time (none on school nights, no more than 2 hours/day on weekends, use of screen time for motivation) Regular exercise(outside and active play) Healthy eating (drink water or milk, no sodas/sweet tea, limit portions and no seconds).   NEXT APPOINTMENT: Return in about 3 months (around 01/17/2020) for f/u visit .  Medical Decision-making: More than 50% of the appointment was spent counseling and discussing diagnosis and management of symptoms with the patient and family.  Vickie Curie, NP Counseling Time: 25 mins  Total Contact Time: 30 mins

## 2019-10-17 NOTE — Progress Notes (Signed)
   Vickie Miller [birth year] [id number]   History:  24 y.o. G0 presents for annual exam. Monthly cycle on Sprintec. Virgin. Gardasil series complete. Saw PCP recently and did lab work.   Gynecologic History Patient's last menstrual period was 10/03/2019. Period Cycle (Days): 28 Period Duration (Days): 5 Period Pattern: Regular Menstrual Flow: Moderate Menstrual Control: Maxi pad, Tampon Dysmenorrhea: (!) Mild Dysmenorrhea Symptoms: Cramping Contraception: OCP (estrogen/progesterone) Last Pap: 10/12/2018. Results were: normal   Past medical history, past surgical history, family history and social history were all reviewed and documented in the EPIC chart. 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   ROS:  A ROS was performed and pertinent positives and negatives are included.  Exam:  Vitals:   10/17/19 0908  BP: 110/78  Weight: 95 lb (43.1 kg)  Height: 4\' 11"  (1.499 m)   Body mass index is 19.19 kg/m.  General appearance:  Normal Thyroid:  Symmetrical, normal in size, without palpable masses or nodularity. Respiratory  Auscultation:  Clear without wheezing or rhonchi Cardiovascular  Auscultation:  Regular rate, without rubs, murmurs or gallops  Edema/varicosities:  Not grossly evident Abdominal  Soft,nontender, without masses, guarding or rebound.  Liver/spleen:  No organomegaly noted  Hernia:  None appreciated  Skin  Inspection:  Grossly normal   Breasts: Examined lying and sitting.   Right: Without masses, retractions, discharge or axillary adenopathy.   Left: Without masses, retractions, discharge or axillary adenopathy. Gentitourinary   Inguinal/mons:  Normal without inguinal adenopathy  External genitalia:  Normal  BUS/Urethra/Skene's glands:  Normal  Vagina:  Normal  Cervix:  Normal  Uterus:  Normal in size, shape and contour.  Midline and mobile  Adnexa/parametria:     Rt: Without masses or tenderness.   Lt: Without masses or tenderness.  Anus and  perineum: Normal   Assessment/Plan:  24 y.o. G0 for annual exam.  Well female exam with routine gynecological exam - Education provided on SBEs, importance of preventative screenings, current guidelines, safe sex practices and use of condoms for STD prevention, campus safety, high calcium diet, regular exercise, and multivitamin daily.   Encounter for surveillance of contraceptive pills - taking as prescribed, having regular monthly cycles.   Follow up in 1 year for annual     [facility], 9:22 AM 10/17/2019

## 2019-12-07 ENCOUNTER — Other Ambulatory Visit: Payer: Self-pay

## 2019-12-07 DIAGNOSIS — Z3041 Encounter for surveillance of contraceptive pills: Secondary | ICD-10-CM

## 2019-12-08 MED ORDER — NORGESTIMATE-ETH ESTRADIOL 0.25-35 MG-MCG PO TABS: 1.0000 | ORAL_TABLET | Freq: Every day | ORAL | 4 refills | Status: AC

## 2019-12-12 ENCOUNTER — Other Ambulatory Visit: Payer: Self-pay | Admitting: Family

## 2019-12-12 MED ORDER — AMPHETAMINE SULFATE 10 MG PO TABS: 10.0000 mg | ORAL_TABLET | Freq: Two times a day (BID) | ORAL | 0 refills | Status: DC

## 2019-12-12 NOTE — Telephone Encounter (Signed)
Patient called for refill for [name] 10 mg, #180.  Patient last seen 10/17/19, next appointment 02/10/20.  Please e-scribe to [facility] in [facility].

## 2019-12-12 NOTE — Telephone Encounter (Signed)
E-Prescribed [name] 10 mg #180 directly to  [facility] [address], [place] - [address] [place] [address] Phone: [phone] Fax: [phone]

## 2020-02-10 ENCOUNTER — Other Ambulatory Visit: Payer: Self-pay

## 2020-02-10 ENCOUNTER — Encounter: Payer: Self-pay | Admitting: Family

## 2020-02-10 ENCOUNTER — Telehealth (INDEPENDENT_AMBULATORY_CARE_PROVIDER_SITE_OTHER): Payer: 59 | Admitting: Family

## 2020-02-10 DIAGNOSIS — Z7189 Other specified counseling: Secondary | ICD-10-CM

## 2020-02-10 DIAGNOSIS — R480 Dyslexia and alexia: Secondary | ICD-10-CM

## 2020-02-10 DIAGNOSIS — Z79899 Other long term (current) drug therapy: Secondary | ICD-10-CM

## 2020-02-10 DIAGNOSIS — F819 Developmental disorder of scholastic skills, unspecified: Secondary | ICD-10-CM | POA: Diagnosis not present

## 2020-02-10 DIAGNOSIS — F900 Attention-deficit hyperactivity disorder, predominantly inattentive type: Secondary | ICD-10-CM

## 2020-02-10 DIAGNOSIS — R278 Other lack of coordination: Secondary | ICD-10-CM | POA: Diagnosis not present

## 2020-02-10 DIAGNOSIS — Z719 Counseling, unspecified: Secondary | ICD-10-CM

## 2020-02-10 DIAGNOSIS — F419 Anxiety disorder, unspecified: Secondary | ICD-10-CM

## 2020-02-10 NOTE — Progress Notes (Signed)
[facility] [address], [place] [place] [address] Dept: [phone] Dept Fax: [phone]  Medication Check visit via Virtual Video due to COVID-19  Patient ID:  Vickie Miller  female DOB: [birth year]   24 y.o.   MRN: [id number]   DATE:02/10/20  PCP: [name], MD  Virtual Visit via Video Note  I connected with  Vickie Miller  on 02/10/20 at  2:00 PM EDT by a video enabled telemedicine application and verified that I am speaking with the correct person using two identifiers. Patient/Parent Location: at school   I discussed the limitations, risks, security and privacy concerns of performing an evaluation and management service by telephone and the availability of in person appointments. I also discussed with the parents that there may be a patient responsible charge related to this service. The parents expressed understanding and agreed to proceed.  Provider: Carron Curie, NP  Location: private location  HISTORY/CURRENT STATUS: Vickie Miller is here for medication management of the psychoactive medications for ADHD and review of educational and behavioral concerns.   Vickie Miller currently taking  Intuniv and [name], which is working well. Takes medication at 8:00 am. Medication tends to wear off around evening. Vickie Miller is able to focus through school/homework.   Vickie Miller is eating well (eating breakfast, lunch and dinner). Eating with no issues  Sleeping well (getting plenty of sleep with good routine), sleeping through the night.   EDUCATION: School: [employer] Year/Grade: transfer student  Performance/ Grades: average Services: Other: Disability Services  Activities/ Exercise: intermittently  Screen time: (phone, tablet, TV, computer): computer for learning, phone, TV and movies.   MEDICAL HISTORY: Individual Medical History/ Review of Systems:  Changes? :Yes, more anxiety with certain situations.   Family Medical/ Social History: Changes? No Patient Lives with: roommate at school  Current Medications:  Current Outpatient Medications on File Prior to Visit  Medication Sig Dispense Refill  . Amphetamine Sulfate ([name]) 10 MG TABS Take 10 mg by mouth 2 (two) times daily. 3 month supply Supervisor: Nelly Rout DEA:  [id number] NPI:  [id number] NCID: [id number] 180 tablet 0  . guanFACINE (INTUNIV) 2 MG TB24 ER tablet TAKE 1 TABLET BY MOUTH EVERY DAY 90 tablet 0  . norgestimate-ethinyl estradiol (SPRINTEC 28) 0.25-35 MG-MCG tablet Take 1 tablet by mouth daily. 84 tablet 4  . acyclovir (ZOVIRAX) 400 MG tablet Take 1 tablet 5 times daily as needed for 3 to 5 days. (Patient not taking: Reported on 10/17/2019) 30 tablet 1   No current facility-administered medications on file prior to visit.   Medication Side Effects: None  MENTAL HEALTH: Mental Health Issues:   Anxiety -more situational and patient not using coping mechanisms at this time.   DIAGNOSES:    ICD-10-CM   1. ADHD (attention deficit hyperactivity disorder), inattentive type  F90.0   2. Problems with learning  F81.9   3. Dysgraphia  R27.8   4. Dyslexia  R48.0   5. Medication management  Z79.899   6. Patient counseled  Z71.9   7. Goals of care, counseling/discussion  Z71.89   8. Anxiousness  F41.9    RECOMMENDATIONS:  Discussed recent history with patient with updates with school, learning, academic progress, health, anxiety and medication management.   Recent increase in situational anxiety by patient and discussed breathing techniques and use of yoga to assist with mindfulness. Also recommended counseling on campus as needed.   Discussed school academic progress and recommended  continued accommodations as needed for learning in a college situation.   Discussed growth and development and current weight. Recommended healthy food choices, watching portion sizes,  avoiding second helpings, avoiding sugary drinks like soda and tea, drinking more water, getting more exercise.   Discussed continued need for structure, routine, reward (external), motivation (internal), positive reinforcement, consequences, and organization with school, dorm life, activities, and peer/social interactions.   Encouraged recommended limitations on TV, tablets, phones, video games and computers for non-educational activities.   Discussed need for bedtime routine, use of good sleep hygiene, no video games, TV or phones for an hour before bedtime.   Encouraged physical activity and outdoor play, maintaining social distancing.   Counseled medication pharmacokinetics, options, dosage, administration, desired effects, and possible side effects.   Intuniv 2 mg daily, No RF needed today (Discussed possible increase if needed) [name] 10 mg daily 1-2 tablets, no Rx today   I discussed the assessment and treatment plan with the patient/parent. The patient/parent was provided an opportunity to ask questions and all were answered. The patient/ parent agreed with the plan and demonstrated an understanding of the instructions.   I provided 25 minutes of non-face-to-face time during this encounter.   Completed record review for 10 minutes prior to the virtual video visit.   NEXT APPOINTMENT:  Return in about 3 months (around 05/12/2020) for f/u visit.  The patient/parent was advised to call back or seek an in-person evaluation if the symptoms worsen or if the condition fails to improve as anticipated.  Medical Decision-making: More than 50% of the appointment was spent counseling and discussing diagnosis and management of symptoms with the patient and family.  Carron Curie, NP

## 2020-03-12 ENCOUNTER — Other Ambulatory Visit: Payer: Self-pay

## 2020-03-12 DIAGNOSIS — F900 Attention-deficit hyperactivity disorder, predominantly inattentive type: Secondary | ICD-10-CM

## 2020-03-12 MED ORDER — GUANFACINE HCL ER 2 MG PO TB24: 2.0000 mg | ORAL_TABLET | Freq: Every day | ORAL | 0 refills | Status: DC

## 2020-03-12 MED ORDER — AMPHETAMINE SULFATE 10 MG PO TABS: 10.0000 mg | ORAL_TABLET | Freq: Two times a day (BID) | ORAL | 0 refills | Status: DC

## 2020-03-12 NOTE — Telephone Encounter (Signed)
Patient called in for refill for [name] and Intuniv. Last visit 02/10/2020 next visit 05/01/2020. Please escribe to [facility] in [place], [place]

## 2020-03-12 NOTE — Telephone Encounter (Signed)
E-Prescribed [name] 10 mg tabs and Intuniv 2 mg directly to  [facility] [address] [place]), [place] - [address] [place]) [place] [address] Phone: [phone] Fax: [phone]

## 2020-03-16 ENCOUNTER — Other Ambulatory Visit: Payer: Self-pay

## 2020-03-16 ENCOUNTER — Telehealth: Payer: Self-pay | Admitting: Family

## 2020-03-16 MED ORDER — AMPHETAMINE SULFATE 10 MG PO TABS: 10.0000 mg | ORAL_TABLET | Freq: Two times a day (BID) | ORAL | 0 refills | Status: DC

## 2020-03-16 NOTE — Telephone Encounter (Signed)
[name] 10 mg BID, # 70 with no RF"s. Patient out of town for the holiday and needing a RF sent to a different pharmacy related to lack of supplies at current pharmacy. RX for above e-scribed and sent to pharmacy on record  [facility] - [place], [place] - [address] [facility] [address] [place] [address] Phone: [phone] Fax: [phone]

## 2020-04-05 ENCOUNTER — Other Ambulatory Visit: Payer: Self-pay | Admitting: Family

## 2020-04-05 DIAGNOSIS — F900 Attention-deficit hyperactivity disorder, predominantly inattentive type: Secondary | ICD-10-CM

## 2020-04-05 NOTE — Telephone Encounter (Signed)
RX for above e-scribed and sent to pharmacy on record  [id number] - [place], [place] - [address] [place] [address] Phone: [phone] Fax: [phone]

## 2020-04-05 NOTE — Telephone Encounter (Signed)
Last visit 02/10/2020 next visit 05/01/2020

## 2020-04-12 ENCOUNTER — Other Ambulatory Visit: Payer: Self-pay

## 2020-04-12 NOTE — Telephone Encounter (Signed)
Patient called in for refill for [name]. Last visit 02/10/2020 next visit 05/01/2020. Please escribe to [facility] in [place], [place]

## 2020-04-13 MED ORDER — AMPHETAMINE SULFATE 10 MG PO TABS: 10.0000 mg | ORAL_TABLET | Freq: Two times a day (BID) | ORAL | 0 refills | Status: DC

## 2020-04-13 NOTE — Telephone Encounter (Signed)
RX for above e-scribed and sent to pharmacy on record  [facility] [address] [place], [place] - [address] [place] [address] Phone: [phone] Fax: [phone]

## 2020-05-01 ENCOUNTER — Telehealth (INDEPENDENT_AMBULATORY_CARE_PROVIDER_SITE_OTHER): Payer: 59 | Admitting: Family

## 2020-05-01 ENCOUNTER — Encounter: Payer: Self-pay | Admitting: Family

## 2020-05-01 DIAGNOSIS — Z79899 Other long term (current) drug therapy: Secondary | ICD-10-CM

## 2020-05-01 DIAGNOSIS — F900 Attention-deficit hyperactivity disorder, predominantly inattentive type: Secondary | ICD-10-CM

## 2020-05-01 DIAGNOSIS — F819 Developmental disorder of scholastic skills, unspecified: Secondary | ICD-10-CM | POA: Diagnosis not present

## 2020-05-01 DIAGNOSIS — Z8659 Personal history of other mental and behavioral disorders: Secondary | ICD-10-CM | POA: Diagnosis not present

## 2020-05-01 DIAGNOSIS — F1921 Other psychoactive substance dependence, in remission: Secondary | ICD-10-CM

## 2020-05-01 DIAGNOSIS — Z7189 Other specified counseling: Secondary | ICD-10-CM

## 2020-05-01 NOTE — Progress Notes (Signed)
[facility] [address] [place] [place] [address] Dept: [phone] Dept Fax: [phone]  Medication Check visit via Virtual Video   Patient ID:  Vickie Miller  female DOB: [birth year]   25 y.o.   MRN: [id number]   DATE:05/01/20  PCP: [name], MD  Virtual Visit via Video Note  I connected with  Vickie Miller on 05/01/20 at  9:00 AM EST by a video enabled telemedicine application and verified that I am speaking with the correct person using two identifiers. Patient/Parent Location: at home   I discussed the limitations, risks, security and privacy concerns of performing an evaluation and management service by telephone and the availability of in person appointments. I also discussed with the parents that there may be a patient responsible charge related to this service. The parents expressed understanding and agreed to proceed.  Provider: Carron Curie, NP  Location: private work location  HISTORY/CURRENT STATUS: Vickie Miller is here for medication management of the psychoactive medications for ADHD and review of educational and behavioral concerns.   Lotta currently taking medication regimen as directed,  which is working well. Takes medication as directed daily.  Medication tends to wear off around evening time. Kaytee is able to focus through school/homework.   Khiara is eating well (eating breakfast, lunch and dinner). Eating better and cooking more at school with roommate.   Sleeping well (getting plenty of rest), sleeping through the night.   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   MEDICAL HISTORY: Individual Medical History/ Review of Systems: Changes? :None reported recently. Had routine care scheduled.   Family Medical/ Social History: Changes? None Patient Lives with: parents at home, roommate at school  Current Medications:  Current Outpatient Medications  Medication Instructions  . acyclovir (ZOVIRAX) 400 MG tablet Take 1 tablet 5 times daily as needed for 3 to 5 days.  . Amphetamine Sulfate ([name]) 10 mg, Oral, 2 times daily, 3 month supply Supervisor: Nelly Rout DEA:  [id number] NPI:  [id number] NCID: [id number]  . guanFACINE (INTUNIV) 2 MG TB24 ER tablet TAKE 1 TABLET BY MOUTH EVERY DAY  . norgestimate-ethinyl estradiol (SPRINTEC 28) 0.25-35 MG-MCG tablet 1 tablet, Oral, Daily   Medication Side Effects: None  MENTAL HEALTH: Mental Health Issues:   history of anxiety-no recent need for treatment.    DIAGNOSES:    ICD-10-CM   1. ADHD (attention deficit hyperactivity disorder), inattentive type  F90.0   2. History of anxiety  Z86.59   3. Problems with learning  F81.9   4. Medication management  Z79.899   5. Medication addiction in remission (HCC)  F19.21   6. Goals of care, counseling/discussion  Z71.89    RECOMMENDATIONS:  Discussed recent history with patient with updates for school,  Discussed school academic progress and recommended continued accommodations for the school year  Discussed health and recent changes with health updates from last f/u visit. Recommended healthy food choices, watching portion sizes, avoiding second helpings, avoiding sugary drinks like soda and tea, drinking more water, getting more exercise.   Discussed continued need for structure, routine, reward (external), motivation (internal), positive reinforcement, consequences, and organization with school, home, and social interactions.   Encouraged recommended  limitations on TV, tablets, phones, video  games and computers for non-educational activities.   Discussed need for bedtime routine, use of good sleep hygiene, no video games, TV or phones for an hour before bedtime.   Encouraged physical activity and outdoor play, maintaining social distancing.   Counseled medication pharmacokinetics, options, dosage, administration, desired effects, and possible side effects.   [name] 10 mg 2 daily, last Rx not filled for 3 months in [place] due to controlled substance laws.  Intuniv 2 mg daily, no Rx today  I discussed the assessment and treatment plan with the patient. The patient was provided an opportunity to ask questions and all were answered. The patient agreed with the plan and demonstrated an understanding of the instructions.   I provided 45 minutes of non-face-to-face time during this encounter. Completed record review for 10 minutes prior to the virtual video visit.   NEXT APPOINTMENT:  Return in about 3 months (around 07/30/2020) for f/u visit.  The patient was advised to call back or seek an in-person evaluation if the symptoms worsen or if the condition fails to improve as anticipated.  Medical Decision-making: More than 50% of the appointment was spent counseling and discussing diagnosis and management of symptoms with the patient and family.  Carron Curie, NP

## 2020-05-11 ENCOUNTER — Other Ambulatory Visit: Payer: Self-pay

## 2020-05-11 MED ORDER — AMPHETAMINE SULFATE 10 MG PO TABS: 10.0000 mg | ORAL_TABLET | Freq: Two times a day (BID) | ORAL | 0 refills | Status: DC

## 2020-05-11 NOTE — Telephone Encounter (Signed)
E-Prescribed [name] 10 90 day supply directly to  [id number] - [place], [place] - [address] [place] [address] Phone: [phone] Fax: [phone]

## 2020-05-11 NOTE — Telephone Encounter (Signed)
Last visit 05/01/2020 next visit 08/20/2020

## 2020-05-15 ENCOUNTER — Telehealth: Payer: Self-pay

## 2020-05-15 NOTE — Telephone Encounter (Signed)
Outcome Approvedtoday Request Reference Number: [id number]. AMPHETAMINE TAB 10MG  is approved through 05/15/2021. Your patient may now fill this prescription and it will be covered

## 2020-06-04 ENCOUNTER — Other Ambulatory Visit: Payer: Self-pay

## 2020-06-04 MED ORDER — AMPHETAMINE SULFATE 10 MG PO TABS: 10.0000 mg | ORAL_TABLET | Freq: Two times a day (BID) | ORAL | 0 refills | Status: DC

## 2020-06-04 NOTE — Telephone Encounter (Signed)
Last visit 05/01/2020 next visit 08/20/2020 

## 2020-06-04 NOTE — Telephone Encounter (Signed)
E-Prescribed [name] 10 directly to  [id number] - [place], [place] - [address] [place] [address] Phone: [phone] Fax: [phone]

## 2020-06-05 ENCOUNTER — Telehealth: Payer: Self-pay | Admitting: Family

## 2020-06-05 MED ORDER — EVEKEO ODT 20 MG PO TBDP: 20.0000 mg | ORAL_TABLET | Freq: Every day | ORAL | 0 refills | Status: DC

## 2020-06-05 NOTE — Telephone Encounter (Signed)
Change this month to [name] 20 mg ODT daily, can use 1/2 tablet BID, # 30 with no RF's.RX for above e-scribed and sent to pharmacy on record  [id number] [place], [place] - [address] [place] [address] Phone: [phone] Fax: [phone]

## 2020-06-06 ENCOUNTER — Telehealth: Payer: Self-pay

## 2020-06-06 NOTE — Telephone Encounter (Signed)
Outcome Approvedtoday Request Reference Number: [id number]. [name] ODT TAB 20MG  is approved through 06/06/2021. Your patient may now fill this prescription and it will be covered.

## 2020-07-02 ENCOUNTER — Other Ambulatory Visit: Payer: Self-pay

## 2020-07-02 MED ORDER — AMPHETAMINE SULFATE 10 MG PO TABS
10.0000 mg | ORAL_TABLET | Freq: Two times a day (BID) | ORAL | 0 refills | Status: DC
Start: 2020-07-02 — End: 2020-07-23

## 2020-07-02 NOTE — Telephone Encounter (Signed)
Last visit 05/01/2020 next visit 08/20/2020 

## 2020-07-02 NOTE — Telephone Encounter (Signed)
[name] 10 mg BID, # 60 with no RF's.RX for above e-scribed and sent to pharmacy on record  [id number] - [place], [place] - [address] [place] [address] Phone: [phone] Fax: [phone]

## 2020-07-23 ENCOUNTER — Other Ambulatory Visit: Payer: Self-pay

## 2020-07-23 MED ORDER — AMPHETAMINE SULFATE 10 MG PO TABS
10.0000 mg | ORAL_TABLET | Freq: Two times a day (BID) | ORAL | 0 refills | Status: DC
Start: 2020-07-23 — End: 2020-08-20

## 2020-07-23 NOTE — Telephone Encounter (Signed)
Last visit 05/01/2020 next visit 08/20/2020

## 2020-07-23 NOTE — Telephone Encounter (Signed)
[name] 10 mg daily BID, # 60 with no RF's.RX for above e-scribed and sent to pharmacy on record  [id number] [place], [place] - [address] [place] [address] Phone: [phone] Fax: [phone]

## 2020-08-10 ENCOUNTER — Telehealth: Payer: 59 | Admitting: Family

## 2020-08-20 ENCOUNTER — Other Ambulatory Visit: Payer: Self-pay

## 2020-08-20 ENCOUNTER — Telehealth (INDEPENDENT_AMBULATORY_CARE_PROVIDER_SITE_OTHER): Payer: PPO | Admitting: Family

## 2020-08-20 ENCOUNTER — Encounter: Payer: Self-pay | Admitting: Family

## 2020-08-20 DIAGNOSIS — R278 Other lack of coordination: Secondary | ICD-10-CM

## 2020-08-20 DIAGNOSIS — F819 Developmental disorder of scholastic skills, unspecified: Secondary | ICD-10-CM

## 2020-08-20 DIAGNOSIS — Z8659 Personal history of other mental and behavioral disorders: Secondary | ICD-10-CM | POA: Diagnosis not present

## 2020-08-20 DIAGNOSIS — R55 Syncope and collapse: Secondary | ICD-10-CM | POA: Diagnosis not present

## 2020-08-20 DIAGNOSIS — Z719 Counseling, unspecified: Secondary | ICD-10-CM

## 2020-08-20 DIAGNOSIS — F900 Attention-deficit hyperactivity disorder, predominantly inattentive type: Secondary | ICD-10-CM | POA: Diagnosis not present

## 2020-08-20 DIAGNOSIS — Z79899 Other long term (current) drug therapy: Secondary | ICD-10-CM

## 2020-08-20 DIAGNOSIS — Z7189 Other specified counseling: Secondary | ICD-10-CM

## 2020-08-20 MED ORDER — AMPHETAMINE SULFATE 10 MG PO TABS: 10.0000 mg | ORAL_TABLET | Freq: Two times a day (BID) | ORAL | 0 refills | Status: DC

## 2020-08-20 NOTE — Progress Notes (Signed)
[facility] [address] [place]. [address] [place] [address] Dept: [phone] Dept Fax: [phone]  Medication Check visit via Virtual Video   Patient ID:  Vickie Miller  female DOB: April 27, 1996   25 y.o.   MRN: [id number]   DATE:08/20/20  PCP: [name], MD  Virtual Visit via Video Note  I connected with  [name] on 08/20/20 at  3:00 PM EDT by a video enabled telemedicine application and verified that I am speaking with the correct person using two identifiers. Patient/Parent Location: at school   I discussed the limitations, risks, security and privacy concerns of performing an evaluation and management service by telephone and the availability of in person appointments. I also discussed with the parents that there may be a patient responsible charge related to this service. The parents expressed understanding and agreed to proceed.  Provider: Carron Curie, NP  Location: private work location  HPI/CURRENT STATUS: [name] is here for medication management of the psychoactive medications for ADHD and review of educational and behavioral concerns.   Imogean currently taking [name] and Intuniv, which is working well. Takes medication as directed. Medication tends to wear off around evening time for the [facility]. Intuniv is taken at HS. Queena is able to focus through school/homework.   Thayer is eating well (eating breakfast, lunch and dinner). Eating with no issues.  Sleeping well (getting plenty of sleep), sleeping through the night.   EDUCATION: School: [place], [place]. Lissa Hoard Year/Grade: Graduates May 20th  [employer] internship this summer  [place], [place]  Activities/ Exercise: intermittently  Screen time: (phone, tablet, TV, computer): computer for learning needs.   MEDICAL HISTORY: Individual Medical History/ Review of Systems:  Passed out last week and hit her head. Seen at medical services on campus.   Family Medical/ Social History: Changes? None Patient Lives with: roommate at school  MENTAL HEALTH: Mental Health Issues:   Anxiety-less now reported by patient.   Allergies: No Known Allergies  Current Medications:  Current Outpatient Medications  Medication Instructions  . acyclovir (ZOVIRAX) 400 MG tablet Take 1 tablet 5 times daily as needed for 3 to 5 days.  . Amphetamine Sulfate ([name]) 10 mg, Oral, 2 times daily, Supervisor: Corlis Leak MD MPH, DEA [id number], License number [phone], NPI [id number] month supply.  Marland Kitchen guanFACINE (INTUNIV) 2 MG TB24 ER tablet TAKE 1 TABLET BY MOUTH EVERY DAY  . norgestimate-ethinyl estradiol (SPRINTEC 28) 0.25-35 MG-MCG tablet 1 tablet, Oral, Daily   Medication Side Effects: None  DIAGNOSES:    ICD-10-CM   1. ADHD (attention deficit hyperactivity disorder), inattentive type  F90.0   2. Fainting spell  R55   3. History of anxiety  Z86.59   4. Dysgraphia  R27.8   5. Problems with learning  F81.9   6. Medication management  Z79.899   7. Patient counseled  Z71.9   8. Goals of care, counseling/discussion  Z71.89     ASSESSMENT: Patient tolerating her [name] and Intuniv as prescribed. Reported efficacy for 1-2 tablets daily of the [name] 10 mg due to end of the semester schedule. Intuniv 2 mg with issues, but may be causing recent incidents related to "fainting" spells. Patient reports these have occurred on occasion, about 1 time every few months, but no correlation to sleeping, eating, exercise, or time of the day. Had f/u with medical services on campus regarding the concerns of fainting. No issues with eating, sleeping or social  interactions. Academically will graduate from [employer] in May and return in August for her [employer]. Will participate in an internship this summer in [place]. To consider adjusting medication related to  recent incident along with referral for cardiology.   PLAN/RECOMMENDATIONS:  Patient provided updates for school, internship, graduation, health and medications since last f/u visit.  Discussed graduation from undergraduate program in May and returning to the same college for her Master's degree in the fall. Will continue to receive academic support services on campus with disability services.   Information regarding internship discussed for the summer. 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 Her BF will be interning at the same job this summer as well.   Discussed growth and development and current weight. Recommended healthy food choices, watching portion sizes, avoiding second helpings, avoiding sugary drinks like soda and tea, drinking more water, getting more exercise.     Discussed continued need for structure, routine, reward (external), motivation (internal), positive reinforcement, consequences, and organization  Encouraged regular physical activity with eating a healthy variety of foods daily. Getting enough calories daily along with water encouraged with any exercise or activity.   Discussed recent incident of fainting and f/u with medical services on campus Reviewed B/P with sitting, lying and standing along with normal EKG. Encouraged to f/u with cardiology for consult along with Echocardiogram to R/O any abnormalities.   Reviewed recent medications with Intuniv as possible cause of lower B/P, which could result in fainting spells. To try 1/2 of her current dose for the next month and monitor B/P at different times of the day. May consider discontinuation of medication if persists.   Counseled medication pharmacokinetics, options, dosage, administration, desired effects, and possible side effects.   [name] 10 mg 1-2 daily, # 180 with no RF"s Intuinv 2 mg no Rx To try 1/2 dose for the next month, with possible discontinuation  of medication. RX for above e-scribed and sent to pharmacy on record  [id number] [place], [place] - [address] [place] [address] Phone: [phone] Fax: [phone]  I discussed the assessment and treatment plan with the patient. The patient was provided an opportunity to ask questions and all were answered. The patient agreed with the plan and demonstrated an understanding of the instructions.   I provided 28 minutes of non-face-to-face time during this encounter. Completed record review for 10 minutes prior to the virtual video visit.   NEXT APPOINTMENT:  12/04/2020  Return in about 3 months (around 11/19/2020) for f/u visit.  The patient was advised to call back or seek an in-person evaluation if the symptoms worsen or if the condition fails to improve as anticipated.   Carron Curie, NP

## 2020-08-22 ENCOUNTER — Other Ambulatory Visit: Payer: Self-pay

## 2020-08-22 MED ORDER — AMPHETAMINE SULFATE 10 MG PO TABS: 10.0000 mg | ORAL_TABLET | Freq: Two times a day (BID) | ORAL | 0 refills | Status: DC

## 2020-08-22 NOTE — Telephone Encounter (Signed)
Patient called and would like RX sent to [place], [place]

## 2020-08-22 NOTE — Telephone Encounter (Signed)
Resending to different pharmacy for [name] 10 mg BID, # 180 with no RF's.RX for above e-scribed and sent to pharmacy on record  [facility] [address] [place], [place] - [address] [place] [address] Phone: [phone] Fax: [phone]

## 2020-08-23 NOTE — Telephone Encounter (Signed)
[place] pharm called in stating that they can not fill out of state C2 meds. Patient would like 28 tabs of [name] sent to [facility] in [place], [place] patient will pat out of pocket

## 2020-08-24 MED ORDER — AMPHETAMINE SULFATE 10 MG PO TABS
10.0000 mg | ORAL_TABLET | Freq: Every day | ORAL | 0 refills | Status: DC
Start: 2020-08-24 — End: 2020-08-24

## 2020-08-24 MED ORDER — EVEKEO ODT 20 MG PO TBDP: 20.0000 mg | ORAL_TABLET | Freq: Every day | ORAL | 0 refills | Status: DC

## 2020-08-24 NOTE — Addendum Note (Signed)
Addended by: Carron Curie on: 08/24/2020 01:38 PM   Modules accepted: Orders

## 2020-08-24 NOTE — Telephone Encounter (Signed)
Patient called for Rx to be sent to different pharmacy in [place] due to [place] not allowing C[place] to be filled out of [place]. Called in 28 days of [name] 10 mg daily, no RF's. Patient to pay out of pocket for Rx this month. RX for above e-scribed and sent to pharmacy on record  [id number] - [place], [place] - [address] [place] [address] Phone: [phone] Fax: [phone]

## 2020-08-24 NOTE — Addendum Note (Signed)
Addended by: Burgess Estelle on: 08/24/2020 09:49 AM   Modules accepted: Orders

## 2020-08-24 NOTE — Telephone Encounter (Signed)
[name] 20 mg ODT, # 90 with no RF's.RX for above e-scribed and sent to pharmacy on record  [facility] - [place], [place] - [address] [place] [address] Phone: [phone] Fax: [phone]

## 2020-08-24 NOTE — Addendum Note (Signed)
Addended by: Carron Curie on: 08/24/2020 09:59 AM   Modules accepted: Orders

## 2020-08-24 NOTE — Addendum Note (Signed)
Addended by: Carron Curie on: 08/24/2020 07:10 PM   Modules accepted: Orders

## 2020-10-17 ENCOUNTER — Ambulatory Visit: Payer: 59 | Admitting: Nurse Practitioner

## 2020-10-17 DIAGNOSIS — Z0289 Encounter for other administrative examinations: Secondary | ICD-10-CM

## 2020-12-04 ENCOUNTER — Institutional Professional Consult (permissible substitution): Payer: 59 | Admitting: Family

## 2020-12-18 ENCOUNTER — Ambulatory Visit (INDEPENDENT_AMBULATORY_CARE_PROVIDER_SITE_OTHER): Payer: PPO | Admitting: Family

## 2020-12-18 ENCOUNTER — Encounter: Payer: Self-pay | Admitting: Family

## 2020-12-18 ENCOUNTER — Other Ambulatory Visit: Payer: Self-pay

## 2020-12-18 VITALS — BP 112/68 | HR 76 | Resp 16 | Ht 60.0 in | Wt 99.4 lb

## 2020-12-18 DIAGNOSIS — Z719 Counseling, unspecified: Secondary | ICD-10-CM

## 2020-12-18 DIAGNOSIS — Z79899 Other long term (current) drug therapy: Secondary | ICD-10-CM

## 2020-12-18 DIAGNOSIS — F900 Attention-deficit hyperactivity disorder, predominantly inattentive type: Secondary | ICD-10-CM

## 2020-12-18 DIAGNOSIS — Z8659 Personal history of other mental and behavioral disorders: Secondary | ICD-10-CM | POA: Diagnosis not present

## 2020-12-18 DIAGNOSIS — Z7189 Other specified counseling: Secondary | ICD-10-CM

## 2020-12-18 MED ORDER — AMPHETAMINE SULFATE 10 MG PO TABS: 20.0000 mg | ORAL_TABLET | Freq: Every day | ORAL | 0 refills | Status: DC

## 2020-12-18 NOTE — Progress Notes (Signed)
[facility] [address] [place] [address] Dept: [phone] Dept Fax: [phone] Loc: [phone] Loc Fax: [phone]  Medication Check  Patient ID: Vickie Miller, female  DOB: Apr 11, 1996, 25 y.o.  MRN: [id number]  Date of Evaluation: 12/18/2020 PCP: [name], MD  Accompanied by:  Patient Patient Lives with: roommate  HISTORY/CURRENT STATUS: HPI Patient here by herself for the visit today. Patient interactive with provider. Patient has continued with graduate degree at [employer]. To move back next week to start her semester. No changes reported. 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 No difficulties coming off medication for the summer.   EDUCATION: School: [employer] Year/Grade:  Graduate school , dual degree  Homework Hours Spent: depending on class demands Performance/ Grades: average Services: Other: Disability Services Activities/ Exercise: intermittently  MEDICAL HISTORY: Appetite: Good  MVI/Other: None No issues   Sleep: No changes and sleeping well Concerns: Initiation/Maintenance/Other: None  Individual Medical History/ Review of Systems: Changes? :None, Seen GYN recently.   Allergies: Patient has no known allergies.  Current Medications: Current Outpatient Medications  Medication Instructions   acyclovir (ZOVIRAX) 400 MG tablet Take 1 tablet 5 times daily as needed for 3 to 5 days.   Amphetamine Sulfate ([name]) 20 mg, Oral, Daily   norgestimate-ethinyl estradiol (SPRINTEC 28) 0.25-35 MG-MCG tablet 1 tablet, Oral, Daily   Medication Side Effects: None Family Medical/ Social History: Changes? None reported  MENTAL HEALTH: Mental Health Issues: Anxiety-less now with no concerns  PHYSICAL EXAM; Vitals:  Vitals:    12/18/20 1315  BP: 112/68  Pulse: 76  Resp: 16  Weight: 99 lb 6.4 oz (45.1 kg)  Height: 5' (1.524 m)    General Physical Exam: Unchanged from previous exam, date:08/20/2020 Changed:None reported  DIAGNOSES:    ICD-10-CM   1. ADHD (attention deficit hyperactivity disorder), inattentive type  F90.0     2. History of anxiety  Z86.59     3. Medication management  Z79.899     4. Patient counseled  Z71.9     5. Goals of care, counseling/discussion  Z71.89      ASSESSMENT: Vickie Miller is a 25 year old female with a history of ADHD and Anxiety. Had been well maintained on [name] 20 mg daily dose and stopped the medication for the summer during her internship. To restart before classes next week. Will return to [facility] [place] in the next week to move back in to her apartment. Vickie Miller has continued with disability services at school for her learning needs. No health care changes. Eating better off medication this summer and gained some weight. No difficulties reported with sleeping. [name] 10 mg 1-2 tablets daily will restart and discontinue the OTD due to insurance not covering the medication. F/u in 3 months for routine care.  RECOMMENDATIONS:  Medical updates with no significant changes or medication in the past few months since last f/u visit on 08/20/2020.  Updates for school, learning, classes, credit hours and return to school next week provided by patient.  Vickie Miller has continued with disability services at school. NO problems with getting learning help needed.  Reviewed recent internship and offer from position this summer. Had been in [place] at [employer] with BF from May until recently.   Looking at options for work after graduation. May consider several options depending on job opportunities.   Eating  better off medication this summer and will restart medication next week. Suggested smaller, more frequent meals with protein included.   Good sleep routine with no issues  recently reported. Schedule to be maintained daily and continuing to apply herself with positive motivation.   Counseled medication pharmacokinetics, options, dosage, administration, desired effects, and possible side effects.   [name] 20 mg ODT-Discontinued [name] 10 mg 1-2 daily, # 60 with no RF's RX for above e-scribed and sent to pharmacy on record  [id number] [place], [place] - [address] [place] [address] Phone: [phone] Fax: [phone]  I discussed the assessment and treatment plan with the patient. The patient was provided an opportunity to ask questions and all were answered. The patient agreed with the plan and demonstrated an understanding of the instructions.  NEXT APPOINTMENT: Return in about 3 months (around 03/20/2021) for f/u visit.  Carron Curie, NP Counseling Time: 25 mins Total Contact Time: 30 mins

## 2021-01-22 ENCOUNTER — Other Ambulatory Visit: Payer: Self-pay

## 2021-01-22 MED ORDER — AMPHETAMINE SULFATE 10 MG PO TABS: 20.0000 mg | ORAL_TABLET | Freq: Every day | ORAL | 0 refills | Status: DC

## 2021-01-22 NOTE — Telephone Encounter (Signed)
[name] 10 mg 1-2 daily, # 60 with no RF's.RX for above e-scribed and sent to pharmacy on record  [id number] [place], [place] - [address] [place] [address] Phone: [phone] Fax: [phone]

## 2021-02-22 ENCOUNTER — Other Ambulatory Visit: Payer: Self-pay

## 2021-02-22 MED ORDER — AMPHETAMINE SULFATE 10 MG PO TABS: 20.0000 mg | ORAL_TABLET | Freq: Every day | ORAL | 0 refills | Status: DC

## 2021-02-22 NOTE — Telephone Encounter (Signed)
[name] 10 mg 2 dally, # 60 with no RF's.RX for above e-scribed and sent to pharmacy on record  [id number] [place], [place] - [address] [place] [address] Phone: [phone] Fax: [phone]

## 2021-03-26 ENCOUNTER — Other Ambulatory Visit: Payer: Self-pay

## 2021-03-26 MED ORDER — AMPHETAMINE SULFATE 10 MG PO TABS: 20.0000 mg | ORAL_TABLET | Freq: Every day | ORAL | 0 refills | Status: DC

## 2021-04-04 ENCOUNTER — Telehealth: Payer: Self-pay | Admitting: Family

## 2021-04-04 ENCOUNTER — Telehealth: Payer: Self-pay

## 2021-04-04 ENCOUNTER — Other Ambulatory Visit: Payer: Self-pay

## 2021-04-04 NOTE — Telephone Encounter (Signed)
04/04/21 appointment was scheduled with patient on 03/26/2021 with [facility]. Provider sent link with no response and I also called anf LM

## 2021-04-10 ENCOUNTER — Encounter: Payer: Self-pay | Admitting: Family

## 2021-04-10 ENCOUNTER — Other Ambulatory Visit: Payer: Self-pay

## 2021-04-10 ENCOUNTER — Telehealth (INDEPENDENT_AMBULATORY_CARE_PROVIDER_SITE_OTHER): Payer: PPO | Admitting: Family

## 2021-04-10 DIAGNOSIS — R278 Other lack of coordination: Secondary | ICD-10-CM | POA: Diagnosis not present

## 2021-04-10 DIAGNOSIS — R480 Dyslexia and alexia: Secondary | ICD-10-CM | POA: Diagnosis not present

## 2021-04-10 DIAGNOSIS — F900 Attention-deficit hyperactivity disorder, predominantly inattentive type: Secondary | ICD-10-CM

## 2021-04-10 DIAGNOSIS — Z719 Counseling, unspecified: Secondary | ICD-10-CM

## 2021-04-10 DIAGNOSIS — F819 Developmental disorder of scholastic skills, unspecified: Secondary | ICD-10-CM

## 2021-04-10 DIAGNOSIS — Z7189 Other specified counseling: Secondary | ICD-10-CM

## 2021-04-10 DIAGNOSIS — Z79899 Other long term (current) drug therapy: Secondary | ICD-10-CM

## 2021-04-10 NOTE — Progress Notes (Signed)
[facility] [address] [place] [place] [address] Dept: [phone] Dept Fax: [phone]  Medication Check visit via Virtual Video   Patient ID:  Vickie Miller  female DOB: February 11, 1996   25 y.o.   MRN: [id number]   DATE:04/10/21  PCP: [name], MD  Virtual Visit via Video Note  I connected with  Vickie Miller on 04/10/21 at  3:30 PM EST by a video enabled telemedicine application and verified that I am speaking with the correct person using two identifiers. Patient/Parent Location: at school in [place]   I discussed the limitations, risks, security and privacy concerns of performing an evaluation and management service by telephone and the availability of in person appointments. I also discussed with the parents that there may be a patient responsible charge related to this service. The parents expressed understanding and agreed to proceed.  Provider: Carron Curie, NP  Location: private work location  HPI/CURRENT STATUS: Vickie Miller is here for medication management of the psychoactive medications for ADHD and review of educational and behavioral concerns.   Gari currently taking [name] 15 mg daily,  which is working well. Takes medication as directed and needed during the day. Medication tends to wear off around evening. Iqra is able to focus through school & homework.   Mandi is eating well (eating breakfast, lunch and dinner). Caitlynn does not have appetite suppression with taking 5 mg up to 3 time daily, which helps with appetite.   Sleeping well (getting plenty of sleep each night), sleeping through the night. Akita does not have delayed sleep onset  EDUCATION: School: [employer], [place]  Year/Grade:Graduate school dual degree,  college and 1 more semester before graduation   Performance/ Grades: average Services:  Other: disability services Work at school: 20 hours last semester and applying for jobs Activities/ Exercise: intermittently  MEDICAL HISTORY: Individual Medical History/ Review of Systems: None reported  Has been healthy with no visits to the PCP. WCC due yearly.   Family Medical/ Social History: Changes? None Patient Lives with:  roommate at school  MENTAL HEALTH: Mental Health Issues:   Anxiety-less now with school and medication dose.   Allergies: No Known Allergies  Current Medications:  Current Outpatient Medications on File Prior to Visit  Medication Sig Dispense Refill   Amphetamine Sulfate ([name]) 10 MG TABS Take 20 mg by mouth daily. Please have patient call in for appointment 60 tablet 0   norgestimate-ethinyl estradiol (SPRINTEC 28) 0.25-35 MG-MCG tablet Take 1 tablet by mouth daily. 84 tablet 4   acyclovir (ZOVIRAX) 400 MG tablet Take 1 tablet 5 times daily as needed for 3 to 5 days. (Patient not taking: No sig reported) 30 tablet 1   No current facility-administered medications on file prior to visit.   Medication Side Effects: None  DIAGNOSES:    ICD-10-CM   1. ADHD (attention deficit hyperactivity disorder), inattentive type  F90.0     2. Dysgraphia  R27.8     3. Problems with learning  F81.9     4. Dyslexia  R48.0     5. Medication management  Z79.899     6. Patient counseled  Z71.9     7. Goals of care, counseling/discussion  Z71.89      ASSESSMENT:     [name] is a 25 year old female with a history of ADHD, Anxiety and learning disability. She has been well controlled on [name] 10 mg  up to to 15 mg daily for total dose. Academically doing well with graduation in May. Has support services in place for learning and attention needs. Looking at jobs in [place], [place] due to her BF accepting a job near [place]. No changes with eating, sleeping or health in there past few months. Not getting any counseling or therapy at this time. No mediation or dose changes  needed.   PLAN/RECOMMENDATIONS:  Updates with schooling, academics, graduation and job Psychologist, educational.  Sweta has continued with educational support services at school with disability services on campus.   Discussed graduation in May and job searching in the [place], [place] area.   Philomina has started applying for jobs and waiting on offers in and around the [place] area.  Relocation to [place] due to BF's recent acceptance of a job recently.   Support given for current eating, health and sleeping habits. No difficulties reported.   Counseled medication pharmacokinetics, options, dosage, administration, desired effects, and possible side effects.   [name] 10 mg 1-2 daily, no Rx today Patient to call in for RF once back in [facility] for pharmacy location when she is home for winter break.    I discussed the assessment and treatment plan with the patient. The patient was provided an opportunity to ask questions and all were answered. The patient agreed with the plan and demonstrated an understanding of the instructions.   NEXT APPOINTMENT:  Visit date not found-next f/u in March of 2023 Telehealth OK  The patient was advised to call back or seek an in-person evaluation if the symptoms worsen or if the condition fails to improve as anticipated.  Carron Curie, NP

## 2021-04-12 ENCOUNTER — Encounter: Payer: Self-pay | Admitting: Family

## 2021-04-23 ENCOUNTER — Other Ambulatory Visit: Payer: Self-pay

## 2021-04-23 MED ORDER — AMPHETAMINE SULFATE 10 MG PO TABS
20.0000 mg | ORAL_TABLET | Freq: Every day | ORAL | 0 refills | Status: DC
Start: 2021-04-23 — End: 2021-05-29

## 2021-04-23 NOTE — Telephone Encounter (Signed)
[name] 10 mg 2 daily, # 60 with no RF's.RX for above e-scribed and sent to pharmacy on record  [id number] - [address] [place] - [address] [place] [address] Phone: [phone] Fax: [phone]

## 2021-05-29 ENCOUNTER — Other Ambulatory Visit: Payer: Self-pay

## 2021-05-29 MED ORDER — AMPHETAMINE SULFATE 10 MG PO TABS: 20.0000 mg | ORAL_TABLET | Freq: Every day | ORAL | 0 refills | Status: DC

## 2021-05-29 NOTE — Telephone Encounter (Signed)
[name] 10 m 1-2 daily, # 60 with no RF's.RX for above e-scribed and sent to pharmacy on record  [id number] - [place], [place] - [address] [place] [address] Phone: [phone] Fax: [phone]

## 2021-05-30 ENCOUNTER — Telehealth: Payer: Self-pay

## 2021-05-30 NOTE — Telephone Encounter (Signed)
Outcome Approvedtoday Request Reference Number: [id number]. AMPHETAMINE TAB 10MG  is approved through 05/30/2022. Your patient may now fill this prescription and it will be covered.

## 2021-06-28 ENCOUNTER — Other Ambulatory Visit: Payer: Self-pay

## 2021-06-28 ENCOUNTER — Telehealth (INDEPENDENT_AMBULATORY_CARE_PROVIDER_SITE_OTHER): Payer: PPO | Admitting: Family

## 2021-06-28 ENCOUNTER — Encounter: Payer: Self-pay | Admitting: Family

## 2021-06-28 DIAGNOSIS — Z79899 Other long term (current) drug therapy: Secondary | ICD-10-CM | POA: Diagnosis not present

## 2021-06-28 DIAGNOSIS — R278 Other lack of coordination: Secondary | ICD-10-CM | POA: Diagnosis not present

## 2021-06-28 DIAGNOSIS — R480 Dyslexia and alexia: Secondary | ICD-10-CM

## 2021-06-28 DIAGNOSIS — Z719 Counseling, unspecified: Secondary | ICD-10-CM

## 2021-06-28 DIAGNOSIS — Z7189 Other specified counseling: Secondary | ICD-10-CM

## 2021-06-28 MED ORDER — AMPHETAMINE SULFATE 10 MG PO TABS: 20.0000 mg | ORAL_TABLET | Freq: Every day | ORAL | 0 refills | Status: DC

## 2021-06-28 NOTE — Progress Notes (Signed)
?[facility] ?[facility] ?[address] [place] ?[place] [address] ?Dept: [phone] ?Dept Fax: [phone] ? ?Medication Check visit via Virtual Video  ? ?Patient ID:  Vickie Miller  female DOB: Jun 09, 1995   26 y.o.   MRN: [id number]  ? ?DATE:06/28/21 ? ?PCP: [name], MD ? ? ?Virtual Visit via Video Note ? ?I connected with  Vickie Miller on 06/28/21 at 10:00 AM EST by a video enabled telemedicine application and verified that I am speaking with the correct person using two identifiers. Patient/Parent Location: at school ?  ?I discussed the limitations, risks, security and privacy concerns of performing an evaluation and management service by telephone and the availability of in person appointments. I also discussed with the parents that there may be a patient responsible charge related to this service. The parents expressed understanding and agreed to proceed. ? ?Provider: Carron Curie, NP  Location: private work location ? ?HPI/CURRENT STATUS: ?Vickie Miller is here for medication management of the psychoactive medications for ADHD and review of educational and behavioral concerns.  ? ?Vickie Miller currently taking [name] 10-15 most days, which is working well. Takes medication as directed each day with dosing . Medication tends to wear off around evening time and can take another dose. Vickie Miller is able to focus through school & homework.  ? ?Vickie Miller is eating well (eating breakfast, lunch and dinner). Vickie Miller does not have appetite suppression ? ?Sleeping well (getting plenty of sleep), sleeping through the night. Vickie Miller does not have delayed sleep onset ? ?EDUCATION: ?School: [address], [place]. 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 ?MEDICAL HISTORY: ?Individual Medical History/ Review of Systems: Yes, recent cold.  Has been healthy with no visits to the PCP. WCC due yearly.  ? ?Family Medical/ Social History: Changes? None reported ?Patient Lives with: parents ? ?MENTAL HEALTH: ?Mental Health Issues: Anxiety-none reported recently.   ? ?Allergies: ?No Known Allergies ? ?Current Medications:  ?Current Outpatient Medications on File Prior to Visit  ?Medication Sig Dispense Refill  ? norgestimate-ethinyl estradiol (SPRINTEC 28) 0.25-35 MG-MCG tablet Take 1 tablet by mouth daily. 84 tablet 4  ? acyclovir (ZOVIRAX) 400 MG tablet Take 1 tablet 5 times daily as needed for 3 to 5 days. (Patient not taking: Reported on 10/17/2019) 30 tablet 1  ? ?No current facility-administered medications on file prior to visit.  ? ?Medication Side Effects: None ? ?DIAGNOSES:  ?  ICD-10-CM   ?1. Dysgraphia  R27.8   ?  ?2. Dyslexia  R48.0   ?  ?3. Medication management  Z79.899   ?  ?4. Patient counseled  Z71.9   ?  ?5. Goals of care, counseling/discussion  Z71.89   ?  ? ?ASSESSMENT:      ?Vickie Miller is a  26 year old female with a history of ADHD, Anxiety and Learning difficulties. She has been maintained on [name] 10 mg 1-2 tablets daily with reported efficacy and no side effects. Academically doing well with disability services in place. To graduate on May 14th and moving to [place], [place] with her boyfriend. Looking at job options and have been applying to various companies in the [place] area. No changes reported with eating, sleeping or health in the past 3 months. Patient wanting to have her next f/u in person after graduation before moving to [facility]  to alleviate the stress of getting into another provider.  ? ?PLAN/RECOMMENDATIONS:  ?Discussed school, health, family and medical changes since the last f/u visit. ? ?Vickie Miller has academically progressed well with her disability services in place. ? ?Discussed graduation with  moving to [place], [place] and provided support with her job search.  ? ?Reviewed current plans to move with BF to [place] since he has a job lined up there after graduation.  ? ?Eating well with no concerns and staying as active as possible with school work.  ? ?No changes with eating habits. Encouraged sleep hygiene with patient.  ? ?Counseled medication pharmacokinetics, options, dosage, administration, desired effects, and possible side effects.   ?[name] 10 mg 1-2 daily, # 60 with no RF's.RX for above e-scribed and sent to pharmacy on record ? [facility] [address] [place] - [address] ?[address] ?[place] [address] ?Phone: [phone] Fax: [phone] ? ?I discussed the assessment and treatment plan with the patient. The patient was provided an opportunity to ask questions and all were answered. The patient agreed with the plan and demonstrated an understanding of the instructions. ?  ?NEXT APPOINTMENT:  ?Visit date not found-f/u visit to be scheduled ?Telehealth OK ? ?The patient was advised to call back or seek an in-person evaluation if the symptoms worsen or if the condition fails to improve as anticipated. ? ? ?Carron Curie, NP ? ?

## 2021-08-01 ENCOUNTER — Other Ambulatory Visit: Payer: Self-pay

## 2021-08-01 MED ORDER — AMPHETAMINE SULFATE 10 MG PO TABS: 20.0000 mg | ORAL_TABLET | Freq: Every day | ORAL | 0 refills | Status: DC

## 2021-08-01 NOTE — Telephone Encounter (Signed)
[name] 10 mg 1-2 daily # 60 with no RF's.RX for above e-scribed and sent to pharmacy on record ? ?[facility] [address] [place] - [address] ?[address] ?[place] [address] ?Phone: [phone] Fax: [phone] ? ? ? ?

## 2021-08-05 ENCOUNTER — Telehealth: Payer: Self-pay | Admitting: Family

## 2021-08-05 MED ORDER — AMPHETAMINE SULFATE 10 MG PO TABS: 20.0000 mg | ORAL_TABLET | Freq: Every day | ORAL | 0 refills | Status: DC

## 2021-08-05 NOTE — Telephone Encounter (Signed)
Vickie Miller called in stated pharmacy will not accept any scripts from NPS from out of state ?

## 2021-09-04 ENCOUNTER — Other Ambulatory Visit: Payer: Self-pay

## 2021-09-05 MED ORDER — AMPHETAMINE SULFATE 10 MG PO TABS: 20.0000 mg | ORAL_TABLET | Freq: Every day | ORAL | 0 refills | Status: DC

## 2021-09-11 ENCOUNTER — Ambulatory Visit (INDEPENDENT_AMBULATORY_CARE_PROVIDER_SITE_OTHER): Payer: PPO | Admitting: Family

## 2021-09-11 ENCOUNTER — Encounter: Payer: Self-pay | Admitting: Family

## 2021-09-11 VITALS — BP 122/72 | HR 72 | Resp 16 | Ht 60.0 in | Wt 99.0 lb

## 2021-09-11 DIAGNOSIS — Z8659 Personal history of other mental and behavioral disorders: Secondary | ICD-10-CM

## 2021-09-11 DIAGNOSIS — Z79899 Other long term (current) drug therapy: Secondary | ICD-10-CM | POA: Diagnosis not present

## 2021-09-11 DIAGNOSIS — Z7189 Other specified counseling: Secondary | ICD-10-CM

## 2021-09-11 DIAGNOSIS — Z719 Counseling, unspecified: Secondary | ICD-10-CM

## 2021-09-11 DIAGNOSIS — F900 Attention-deficit hyperactivity disorder, predominantly inattentive type: Secondary | ICD-10-CM | POA: Diagnosis not present

## 2021-09-11 NOTE — Progress Notes (Signed)
?[facility] ?[facility] ?[facility] ?[address] ?[place] [address] ?Dept: [phone] ?Dept Fax: [phone] ?Loc: [phone] ?Loc Fax: [phone] ? ?Medication Check ? ?Patient ID: Vickie Miller, female  DOB: Jun 22, 1995, 26 y.o.  MRN: [id number] ? ?Date of Evaluation: 09/11/2021 ?PCP: Vickie Brunette, MD ? ?Accompanied by:  self ?Patient Lives with: parents ? ?HISTORY/CURRENT STATUS: ?HPI Patient here by herself for the visit today. Patient interactive and appropriate with provider today. No recent changes or issues since the last visit on 06/28/2021.  Has continued with [name] with less of a dose since graduated from school. Over the past few days been off medication, but will restart with work.  ? 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 ? ?MEDICAL HISTORY: ?Appetite: Good  MVI/Other: None ? ?Sleep: Getting at least 8 hours/night Concerns: Initiation/Maintenance/Other: No issues  ? ?Individual Medical History/ Review of Systems: Changes? :None  ? ?Allergies: Patient has no known allergies. ? ?Current Medications:  ?Current Outpatient Medications  ?Medication Instructions  ? acyclovir (ZOVIRAX) 400 MG tablet Take 1 tablet 5 times daily as needed for 3 to 5 days.  ? Amphetamine Sulfate ([name]) 20 mg, Oral, Daily, Please have patient call in for appointment  ? norgestimate-ethinyl estradiol (SPRINTEC 28) 0.25-35 MG-MCG tablet 1 tablet, Oral, Daily  ? ?Medication Side Effects: None ? ?Family Medical/ Social History: Changes? None reported ? ?MENTAL HEALTH: ?Mental Health Issues:  None ? ?PHYSICAL EXAM; ?Vitals:  ?Vitals:  ? 09/11/21 0804  ?BP: 122/72  ?Pulse: 72  ?Resp: 16  ?Weight: 99 lb (44.9 kg)  ?Height: 5' (1.524 m)  ?  ?General Physical Exam: ?Unchanged from previous exam,  date:06/28/2021 Changed:None ? ?DIAGNOSES:  ?  ICD-10-CM   ?1. ADHD (attention deficit hyperactivity disorder), inattentive type  F90.0   ?  ?2. History of anxiety  Z86.59   ?  ?3. Medication management  Z79.899   ?  ?4. Patient counseled  Z71.9   ?  ?5. Goals of care, counseling/discussion  Z71.89   ?  ? ?ASSESSMENT: ?Vickie Miller is a 26 year old female with a history of ADHD and Anxiety. Patient graduated school recently and looking for a job. Has relocated back home to [facility] but parents now moving to [place]. BF is in [place] and hoping to get a job to move to [place] as well. No medical changes since last visit. Eating more recently not on medication or taking a lower dose. Exercising on a regular basis. To continue on [name] at a lower dose with not using regularly ? ?RECOMMENDATIONS:  ?Updates for school with recent graduation and looking for jobs.  ? ?Patient recently graduated from college with a master's degree with looking for work. ? ?Applied to several jobs and looking to move to [place] with BF. ? ?Discussed recent move from [place] home to [facility] and now parents in the process of transitioning to [place], [place] ? ?Working out more and getting more routine about exercising. ? ?Eating healthier options with more now that she is not taking her medication on a regular basis. Supported healthy meals and snacks.  ? ?Sleep schedule and sleep hygiene discussed with no current issues reported. Can take OTC medication for initiation is needed.  ? ?Counseled medication pharmacokinetics, options, dosage, administration, desired effects, and possible side effects.   ?[name] 10 mg with no Rx today ? ?I discussed the assessment and  treatment plan with the patient. The patient was provided an opportunity to ask questions and all were answered. The patient agreed with the plan and demonstrated an understanding of the instructions. ? ?NEXT APPOINTMENT: Return in about 3 months (around 12/12/2021) for f/u visit. ?  ?The patient was  advised to call back or seek an in-person evaluation if the symptoms worsen or if the condition fails to improve as anticipated. ? ?Carron Curie, NP  ?

## 2021-12-06 ENCOUNTER — Telehealth (INDEPENDENT_AMBULATORY_CARE_PROVIDER_SITE_OTHER): Payer: Self-pay | Admitting: Family

## 2021-12-06 ENCOUNTER — Encounter: Payer: Self-pay | Admitting: Family

## 2021-12-06 DIAGNOSIS — Z8659 Personal history of other mental and behavioral disorders: Secondary | ICD-10-CM

## 2021-12-06 DIAGNOSIS — Z719 Counseling, unspecified: Secondary | ICD-10-CM

## 2021-12-06 DIAGNOSIS — Z79899 Other long term (current) drug therapy: Secondary | ICD-10-CM

## 2021-12-06 DIAGNOSIS — Z7189 Other specified counseling: Secondary | ICD-10-CM

## 2021-12-06 DIAGNOSIS — F900 Attention-deficit hyperactivity disorder, predominantly inattentive type: Secondary | ICD-10-CM

## 2021-12-06 MED ORDER — AMPHETAMINE SULFATE 10 MG PO TABS: 20.0000 mg | ORAL_TABLET | Freq: Every day | ORAL | 0 refills | Status: AC

## 2021-12-06 NOTE — Progress Notes (Addendum)
[facility] [address] [place]. [address] [place] [address] Dept: [phone] Dept Fax: [phone]  Medication Check visit via Telephone   Patient ID:  Vickie Miller  female DOB: 08-17-1995   26 y.o.   MRN: [id number]   DATE:12/06/21  PCP: [name], MD  Virtual Visit via Telephone Note Contacted  Vickie Miller on 12/06/21 at  8:00 AM EDT by telephone and verified that I am speaking with the correct person using two identifiers. Patient/Parent Location: at home  I discussed the limitations, risks, security and privacy concerns of performing an evaluation and management service by telephone and the availability of in person appointments. I also discussed with the parents that there may be a patient responsible charge related to this service. The parents expressed understanding and agreed to proceed.  Provider: Carron Curie, NP  Location: private work location  HPI/CURRENT STATUS: Vickie Miller is here for medication management of the psychoactive medications for ADHD and review of educational and behavioral concerns.   Vickie Miller currently taking [name] as needed for work, which is working well. Takes medication in the morning. Medication tends to wear off around several hours later. Vickie Miller is able to focus through work.   Vickie Miller is eating well (eating breakfast, lunch and dinner). Vickie Miller does not have appetite suppression  Sleeping well (getting plenty of activity), sleeping through the night. 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  MEDICAL HISTORY: Individual Medical History/ Review of Systems: None  Has been healthy with no visits to the PCP. WCC due yearly.   Family Medical/ Social  History: none reported Patient Lives with:  Boyfriend  and dog  MENTAL HEALTH: Mental Health Issues: Anxiety with minimal right now, but mostly with driving and starting new job.   Allergies: No Known Allergies  Current Medications:  Current Outpatient Medications on File Prior to Visit  Medication Sig Dispense Refill   norgestimate-ethinyl estradiol (SPRINTEC 28) 0.25-35 MG-MCG tablet Take 1 tablet by mouth daily. 84 tablet 4   acyclovir (ZOVIRAX) 400 MG tablet Take 1 tablet 5 times daily as needed for 3 to 5 days. (Patient not taking: Reported on 10/17/2019) 30 tablet 1   No current facility-administered medications on file prior to visit.   Medication Side Effects: None  DIAGNOSES:    ICD-10-CM   1. ADHD (attention deficit hyperactivity disorder), inattentive type  F90.0     2. History of anxiety  Z86.59     3. Medication management  Z79.899     4. Patient counseled  Z71.9     5. Goals of care, counseling/discussion  Z71.89      ASSESSMENT:      Vickie Miller is a 26 year old female with a history of ADHD, L/D, with some Anxiety. Taking her [place] mostly for work and has been off medication this summer with graduation from school and looking for a job. Recently was hired in [place] and moved 2 weeks ago in to an apartment with BF. Getting settled in to new area and will be starting work in 2 weeks. Not completely off her medication but will restart in the next 2 weeks for work. Staying active with walking and going to the gym. Eating well with no changes or concerns. No medical updates. Sleeping better with no reported issues. Anxiety has been  up and down due to having to drive to work in an unfamiliar city. Will resume medications with no changes.  PLAN/RECOMMENDATIONS:  Work updates with new job near [place], [place] to start in the next 2 weeks.  Work duties and job details are not familiar right now due her just getting hire on by [employer].   Moved recently to [place] with her  BF and discussed transition.  Supported exercise and activity with her move to a new city.  Suggested healthy eating habits and consumption of water daily for hydration.  Sleeping well with no current concerns, but monitor with new schedule.  Medication management with current medication and doses with no changes needed.   Counseled medication pharmacokinetics, options, dosage, administration, desired effects, and possible side effects.   [name] 10 mg 1-2 daily, #60 with no RF's.RX for above e-scribed and sent to pharmacy on record  [id number] [place] [place] - [address] [facility] [address] [place] [address] Phone: [phone] Fax: [phone]  I discussed the assessment and treatment plan with the patient. The patient was provided an opportunity to ask questions and all were answered. The patient agreed with the plan and demonstrated an understanding of the instructions.   NEXT APPOINTMENT:  03/17/2022-f/u visit  Telehealth OK  The patient was advised to call back or seek an in-person evaluation if the symptoms worsen or if the condition fails to improve as anticipated.  Carron Curie, NP  Visit via telephone for the entire visit: 32 minutes  (was not able to connect via video).

## 2021-12-08 ENCOUNTER — Encounter: Payer: Self-pay | Admitting: Family

## 2022-03-17 ENCOUNTER — Telehealth: Payer: Self-pay | Admitting: Family
